# Patient Record
Sex: Male | Born: 1965 | Race: White | Hispanic: No | Marital: Married | Smoking: Never smoker
Health system: Southern US, Community
[De-identification: ages and names within clinical notes are randomized; demographics above are authoritative.]

## PROBLEM LIST (undated history)

## (undated) DIAGNOSIS — K922 Gastrointestinal hemorrhage, unspecified: Secondary | ICD-10-CM

## (undated) DIAGNOSIS — I10 Essential (primary) hypertension: Secondary | ICD-10-CM

## (undated) DIAGNOSIS — D696 Thrombocytopenia, unspecified: Secondary | ICD-10-CM

## (undated) HISTORY — PX: WRIST SURGERY: SHX841

## (undated) HISTORY — DX: Thrombocytopenia, unspecified: D69.6

## (undated) HISTORY — PX: TRUNK SKIN LESION EXCISIONAL BIOPSY: SUR474

## (undated) HISTORY — DX: Gastrointestinal hemorrhage, unspecified: K92.2

---

## 2005-07-22 ENCOUNTER — Emergency Department (HOSPITAL_COMMUNITY): Admission: EM | Admit: 2005-07-22 | Discharge: 2005-07-22 | Payer: Self-pay | Admitting: Emergency Medicine

## 2006-03-04 ENCOUNTER — Emergency Department (HOSPITAL_COMMUNITY): Admission: EM | Admit: 2006-03-04 | Discharge: 2006-03-04 | Payer: Self-pay | Admitting: Family Medicine

## 2006-03-06 ENCOUNTER — Emergency Department (HOSPITAL_COMMUNITY): Admission: EM | Admit: 2006-03-06 | Discharge: 2006-03-06 | Payer: Self-pay | Admitting: Emergency Medicine

## 2006-06-15 ENCOUNTER — Emergency Department (HOSPITAL_COMMUNITY): Admission: EM | Admit: 2006-06-15 | Discharge: 2006-06-15 | Payer: Self-pay | Admitting: Emergency Medicine

## 2010-10-31 LAB — COMPREHENSIVE METABOLIC PANEL
AST: 31
Albumin: 4.7
Alkaline Phosphatase: 67
Chloride: 109
GFR calc Af Amer: >60
Potassium: 3.8
Total Bilirubin: 1.1

## 2010-10-31 LAB — CBC
Platelets: 132 — ABNORMAL LOW
WBC: 10.8 — ABNORMAL HIGH

## 2010-10-31 LAB — DIFFERENTIAL
Basophils Absolute: 0
Basophils Relative: 0
Eosinophils Absolute: 0.1
Eosinophils Relative: 1
Lymphocytes Relative: 6 — ABNORMAL LOW
Lymphs Abs: 0.6 — ABNORMAL LOW
Monocytes Absolute: 0.2
Monocytes Relative: 2 — ABNORMAL LOW
Neutro Abs: 9.9 — ABNORMAL HIGH
Neutrophils Relative %: 92 — ABNORMAL HIGH

## 2010-10-31 LAB — LIPASE, BLOOD: Lipase: 23

## 2012-03-22 ENCOUNTER — Emergency Department (HOSPITAL_COMMUNITY): Payer: Managed Care, Other (non HMO)

## 2012-03-22 ENCOUNTER — Inpatient Hospital Stay (HOSPITAL_COMMUNITY): Payer: Managed Care, Other (non HMO)

## 2012-03-22 ENCOUNTER — Inpatient Hospital Stay (HOSPITAL_COMMUNITY)
Admission: EM | Admit: 2012-03-22 | Discharge: 2012-03-23 | DRG: 379 | Disposition: A | Discharge status: Home / Self Care | Payer: Managed Care, Other (non HMO) | Attending: Internal Medicine | Admitting: Internal Medicine

## 2012-03-22 ENCOUNTER — Encounter (HOSPITAL_COMMUNITY): Payer: Self-pay | Admitting: Emergency Medicine

## 2012-03-22 DIAGNOSIS — E669 Obesity, unspecified: Secondary | ICD-10-CM

## 2012-03-22 DIAGNOSIS — D696 Thrombocytopenia, unspecified: Secondary | ICD-10-CM | POA: Diagnosis present

## 2012-03-22 DIAGNOSIS — R7302 Impaired glucose tolerance (oral): Secondary | ICD-10-CM

## 2012-03-22 DIAGNOSIS — K7689 Other specified diseases of liver: Secondary | ICD-10-CM | POA: Diagnosis present

## 2012-03-22 DIAGNOSIS — E663 Overweight: Secondary | ICD-10-CM | POA: Diagnosis present

## 2012-03-22 DIAGNOSIS — Z6829 Body mass index (BMI) 29.0-29.9, adult: Secondary | ICD-10-CM

## 2012-03-22 DIAGNOSIS — I10 Essential (primary) hypertension: Secondary | ICD-10-CM | POA: Diagnosis present

## 2012-03-22 DIAGNOSIS — R7309 Other abnormal glucose: Secondary | ICD-10-CM | POA: Diagnosis present

## 2012-03-22 DIAGNOSIS — K760 Fatty (change of) liver, not elsewhere classified: Secondary | ICD-10-CM | POA: Diagnosis present

## 2012-03-22 DIAGNOSIS — R040 Epistaxis: Secondary | ICD-10-CM | POA: Diagnosis present

## 2012-03-22 DIAGNOSIS — K922 Gastrointestinal hemorrhage, unspecified: Secondary | ICD-10-CM

## 2012-03-22 DIAGNOSIS — R748 Abnormal levels of other serum enzymes: Secondary | ICD-10-CM | POA: Diagnosis present

## 2012-03-22 DIAGNOSIS — K920 Hematemesis: Principal | ICD-10-CM | POA: Diagnosis present

## 2012-03-22 HISTORY — DX: Essential (primary) hypertension: I10

## 2012-03-22 LAB — CBC WITH DIFFERENTIAL/PLATELET
Basophils Relative: 0 % (ref 0–1)
Eosinophils Absolute: 0.1 10*3/uL (ref 0.0–0.7)
Eosinophils Relative: 1 % (ref 0–5)
Hemoglobin: 17.1 g/dL — ABNORMAL HIGH (ref 13.0–17.0)
MCH: 30.6 pg (ref 26.0–34.0)
MCHC: 36.5 g/dL — ABNORMAL HIGH (ref 30.0–36.0)
MCV: 83.7 fL (ref 78.0–100.0)
Monocytes Absolute: 0.6 10*3/uL (ref 0.1–1.0)
Monocytes Relative: 6 % (ref 3–12)
Neutrophils Relative %: 87 % — ABNORMAL HIGH (ref 43–77)

## 2012-03-22 LAB — COMPREHENSIVE METABOLIC PANEL
Albumin: 4.9 g/dL (ref 3.5–5.2)
BUN: 21 mg/dL (ref 6–23)
Calcium: 9.5 mg/dL (ref 8.4–10.5)
Creatinine, Ser: 1.05 mg/dL (ref 0.50–1.35)
GFR calc Af Amer: >90 mL/min (ref >90)
Glucose, Bld: 120 mg/dL — ABNORMAL HIGH (ref 70–99)
Potassium: 4 mEq/L (ref 3.5–5.1)
Total Protein: 7.9 g/dL (ref 6.0–8.3)

## 2012-03-22 LAB — CBC
HCT: 41.7 % (ref 39.0–52.0)
Hemoglobin: 14.9 g/dL (ref 13.0–17.0)
Hemoglobin: 15 g/dL (ref 13.0–17.0)
MCH: 31 pg (ref 26.0–34.0)
MCH: 30.5 pg (ref 26.0–34.0)
MCHC: 36.7 g/dL — ABNORMAL HIGH (ref 30.0–36.0)
MCHC: 36 g/dL (ref 30.0–36.0)
Platelets: 81 10*3/uL — ABNORMAL LOW (ref 150–400)
RBC: 4.91 MIL/uL (ref 4.22–5.81)
RDW: 13.8 % (ref 11.5–15.5)

## 2012-03-22 LAB — TYPE AND SCREEN: ABO/RH(D): A NEG

## 2012-03-22 LAB — PROTIME-INR
INR: 0.98 (ref 0.00–1.49)
Prothrombin Time: 12.9 seconds (ref 11.6–15.2)

## 2012-03-22 LAB — APTT: aPTT: 26 seconds (ref 24–37)

## 2012-03-22 LAB — LIPASE, BLOOD: Lipase: 36 U/L (ref 11–59)

## 2012-03-22 MED ORDER — PANTOPRAZOLE SODIUM 40 MG IV SOLR
80.0000 mg | Freq: Once | INTRAVENOUS | Status: AC
  Administered 2012-03-22: 80 mg via INTRAVENOUS
  Filled 2012-03-22 (×2): qty 80

## 2012-03-22 MED ORDER — SODIUM CHLORIDE 0.9 % IV BOLUS (SEPSIS)
1000.0000 mL | Freq: Once | INTRAVENOUS | Status: AC
  Administered 2012-03-22: 1000 mL via INTRAVENOUS

## 2012-03-22 MED ORDER — KETOROLAC TROMETHAMINE 30 MG/ML IJ SOLN
30.0000 mg | Freq: Once | INTRAMUSCULAR | Status: AC
  Administered 2012-03-22: 30 mg via INTRAVENOUS
  Filled 2012-03-22: qty 1

## 2012-03-22 MED ORDER — SODIUM CHLORIDE 0.9 % IV SOLN
INTRAVENOUS | Status: DC
  Administered 2012-03-22 – 2012-03-23 (×2): via INTRAVENOUS

## 2012-03-22 MED ORDER — WHITE PETROLATUM GEL
Status: AC
  Administered 2012-03-22: 0.2
  Filled 2012-03-22: qty 5

## 2012-03-22 MED ORDER — ONDANSETRON HCL 4 MG/2ML IJ SOLN
4.0000 mg | Freq: Three times a day (TID) | INTRAMUSCULAR | Status: AC | PRN
  Administered 2012-03-22: 4 mg via INTRAVENOUS
  Filled 2012-03-22: qty 2

## 2012-03-22 MED ORDER — ONDANSETRON HCL 4 MG/2ML IJ SOLN
4.0000 mg | Freq: Once | INTRAMUSCULAR | Status: AC
  Administered 2012-03-22: 4 mg via INTRAVENOUS
  Filled 2012-03-22: qty 2

## 2012-03-22 MED ORDER — SODIUM CHLORIDE 0.9 % IJ SOLN
3.0000 mL | Freq: Two times a day (BID) | INTRAMUSCULAR | Status: DC
  Administered 2012-03-22 – 2012-03-23 (×2): 3 mL via INTRAVENOUS

## 2012-03-22 MED ORDER — MORPHINE SULFATE 2 MG/ML IJ SOLN
1.0000 mg | INTRAMUSCULAR | Status: DC | PRN
  Administered 2012-03-23: 2 mg via INTRAVENOUS
  Filled 2012-03-22: qty 1

## 2012-03-22 MED ORDER — FENTANYL CITRATE 0.05 MG/ML IJ SOLN
50.0000 ug | Freq: Once | INTRAMUSCULAR | Status: AC
  Administered 2012-03-22: 50 ug via INTRAVENOUS
  Filled 2012-03-22: qty 2

## 2012-03-22 MED ORDER — FUROSEMIDE 10 MG/ML IJ SOLN: 100.0000 mg | Freq: Three times a day (TID) | INTRAMUSCULAR | Status: DC

## 2012-03-22 MED ORDER — TRAMADOL HCL 50 MG PO TABS
50.0000 mg | ORAL_TABLET | Freq: Once | ORAL | Status: AC
  Administered 2012-03-22: 50 mg via ORAL
  Filled 2012-03-22: qty 1

## 2012-03-22 MED ORDER — PANTOPRAZOLE SODIUM 40 MG IV SOLR
40.0000 mg | INTRAVENOUS | Status: DC
Start: 2012-03-23 — End: 2012-03-23
  Administered 2012-03-22: 40 mg via INTRAVENOUS
  Filled 2012-03-22 (×3): qty 40

## 2012-03-22 MED ORDER — SODIUM CHLORIDE 0.9 % IV SOLN
INTRAVENOUS | Status: AC
  Administered 2012-03-22: 1000 mL via INTRAVENOUS

## 2012-03-22 NOTE — Consult Note (Signed)
[facility] Consult: 11:57 AM 03/22/2012   Referring Zerline Melchior: Dr Ranae Palms in ED  Primary Care Physician:  None Primary Gastroenterologist:  unassigned   Reason for Consultation:  Hemetemesis  HPI: Nicholas Jennings is a 47 y.o. male.  Has occasional back pain.  Not using Advil for at least one year.  No significant reflux sxs, anorexia, nausea or other active upper GI sxs in past. Sees blood in stool every couple of months, small amounts with wiping and sometimes in stool itself.  Ate well yesterday at lunch and dinner.  Took 400 Mg Ibuprofen around 10 AM for mid back pain.  Awakened acutely at 2 AM with nausea, proceeded to have 5 episodes of hematemisis, vigorous gagging, epistaxis, over next few hours. No stools.  In ED one episode where a tablespoon or so of blood seen with emesis.  His throat is sore now from gagging.  Also dripping blood from right nares in ED.    Hgb is elevated at 17.1.  coags normal.  BUN elevated at 26 Zofran has relieved the emesis.  Got a bolus or 80 mg Protonix.  No abdominal pain.  No dizziness or presyncope. Currently has headache.  Weight up 15 # in 6 months, not playing golf as much and therefore having less back pain.      Prior to Admission medications   Medication Sig Start Date End Date Taking? Authorizing Nicholas Jennings         ibuprofen (ADVIL,MOTRIN) 200 MG tablet Take 400 mg by mouth every 6 (six) hours as needed for pain or fever. Only took 400 mg once on 03/21/12:  No chronic use.   Yes Historical Nicholas Gunnels, MD    Scheduled Meds: . fentaNYL  50 mcg Intravenous Once   Infusions: . sodium chloride 1,000 mL (03/22/12 1130)   PRN Meds:    Allergies as of 03/22/2012  . (No Known Allergies)    History reviewed. No pertinent family history. Family history: No colon cancer, no ulcer disease, no GI bleeds.   Mom takes anticoagulation.  History    Social History   .  Marital Status:  Married              .      Occupational History   .  Computer work at [employer]     Social History Main Topics   .  Smoking status:  Never Smoker   .  Smokeless tobacco:  Not on file   .  Alcohol Use:  None ever  .  Drug Use:  No         REVIEW OF SYSTEMS: Constitutional:  Per HPI ENT:  Per HPI Pulm:  No SOB or cough CV:  No chest pain or palps GU:  No blood in urine.  No frequency GI:  Per HPI Heme:  No issues with low blood counts.    Transfusions:  never Neuro:  No vision blurriness, no  Tingling or numbness Derm:  No rash, no sores, no itching Endocrine:  No sweats or excessive thirst.  Immunization: no flu shot this term Travel:  none   PHYSICAL EXAM: Vital signs in last 24 hours: Temp:  [98.9 F (37.2 C)] 98.9 F (37.2 C) (03/10 0829) Pulse Rate:  [102-107] 107 (03/10 1100) Resp:  [17-25] 25 (03/10 1100) BP: (92-142)/(51-92) 92/51 mmHg (03/10 1100) SpO2:  [95 %-98 %] 98 % (03/10 1100)  General: anxious, pleasant WM who is slightly diaphoretic.  He does not look acutely ill.  Head:  No asymmetry or signs of trauma  Eyes:  No icterus or pallor Ears:  Not HOH  Nose:  There is blood in and erythema in right nares Mouth:  Beige, coating on tongue.  No bleeding Neck:  No mass, no JVD Lungs:  Clear B.  No resp distress Heart: Tachy (mild), regular.  No MRG Abdomen:  Soft, NT, ND, overweight, no mass, no bruits, no HSM.   Rectal: defered   Musc/Skeltl: no joint swelling or erythema Extremities:  No pedal edema.  Feet warm  Neurologic:  Oriented x3.  No tremor.  Limb strength full. Skin:  No telangectasia Tattoos:  none Nodes:  No inguinal adenopathy GU:  No scrotal edema   Psych:  Pleasant, anxious, appropriate.     LAB RESULTS:  Recent Labs  03/22/12 0846  WBC 10.8*  HGB 17.1*  HCT 46.8  PLT 109*   BMET Lab Results  Component Value Date   NA 144 03/22/2012   NA 145 06/15/2006   K 4.0 03/22/2012   K 3.8 06/15/2006   CL 104 03/22/2012   CL 109 06/15/2006    CO2 28 03/22/2012   CO2 24 06/15/2006   GLUCOSE 120* 03/22/2012   GLUCOSE 145* 06/15/2006   BUN 21 03/22/2012   BUN 26* 06/15/2006   CREATININE 1.05 03/22/2012   CREATININE 1.04 06/15/2006   CALCIUM 9.5 03/22/2012   CALCIUM 9.5 06/15/2006   LFT  Recent Labs  03/22/12 0846  PROT 7.9  ALBUMIN 4.9  AST 34  ALT 62*  ALKPHOS 79  BILITOT 0.9   PT/INR Lab Results  Component Value Date   INR 0.98 03/22/2012      RADIOLOGY STUDIES: Dg Chest Port 1 View 03/22/2012  *RADIOLOGY REPORT*  Clinical Data: Vomiting blood, cough, shortness of breath  PORTABLE CHEST - 1 VIEW  Comparison: None.  Findings: No active infiltrate or effusion is seen.  The heart is within normal limits in size.  No bony abnormality is noted.  IMPRESSION: No active lung disease.   Original Report Authenticated By: Dwyane Dee, M.D.     ENDOSCOPIC STUDIES: None ever  IMPRESSION: *  Hematemesis along with epistaxis.  ? If blood in emesis is of GI vs ENT source.  No active GI sxs leading up to these events. Possible GI causes include MWT, ulcer.  *  Thrombocytopenia.  *  Elevated RBC count.  *  Mild increase of ALT.  *  Hyperglycemia.  Pt certainly could have previously undiscovered AODM.   *  Overweight. ? Obesity.  No weight assay yet.    PLAN: *  No endoscopy.  For now treat as hemetemesis secondary to epistaxis.  Case was d/w Dr Leone Payor.   *  Will begin empiric IV Protonix though got large bolus dose already. CBC BID x 2.  Weigh pt.  *  Consider ENT evaluation.  *  Occasional blood per rectum.  None in > 2 months.  No associated change in bowel habits   LOS: 0 days   Nicholas Jennings  03/22/2012, 11:57 AM Pager: [phone]   Nicholas Jennings GI Attending  I have also seen and assessed the patient and agree with the above note.  My impression is that of epistaxis causing vomiting and hematemesis most likely.  Do not think the limited dose of NSAID precipitated anything.  He does have abnormal transaminase,  Thrombocytopenia (mild and not causing bleeding) fatty liver on 2008 CT - so will check [place] and HCV Ab (all baby  boomers should be screened) . Does not have PE stigmata of cirrhosis.   Also hx hematochezia/rectal bleeding but not in 6 months. Needs eventual colonoscopy.  toradol x 1 for headache.  Iva Boop, MD, [facility] [phone] (pager) 03/22/2012 2:44 PM

## 2012-03-22 NOTE — ED Provider Notes (Signed)
History     CSN: [id number]  Arrival date & time 03/22/12  7829   First MD Initiated Contact with Patient 03/22/12 [id number]      Chief Complaint  Patient presents with  . Hematemesis    (Consider location/radiation/quality/duration/timing/severity/associated sxs/prior treatment) HPI Pt states he woke at 0200 AM retching and vomiting small amounts of red blood. He has had multiple such episodes overnight. 1 episode of loose stool. Pt states he noticed small amount of epistaxis after vomiting this AM. Denied abd pain, chest pain. +ocassional NSAID use. Pt states amount of total blood vomited is less than 1 cup. Past Medical History  Diagnosis Date  . Hypertension     not on medications    Past Surgical History  Procedure Laterality Date  . Wrist surgery      left, cyst and bone spur  . Trunk skin lesion excisional biopsy      back    Family History  Problem Relation Age of Onset  . Thrombocytopenia Mother     History  Substance Use Topics  . Smoking status: Never Smoker   . Smokeless tobacco: Not on file  . Alcohol Use: No      Review of Systems  Constitutional: Positive for diaphoresis. Negative for fever and chills.  HENT: Positive for nosebleeds and neck pain. Negative for sore throat and neck stiffness.   Respiratory: Negative for shortness of breath.   Cardiovascular: Negative for chest pain.  Gastrointestinal: Positive for nausea and vomiting. Negative for abdominal pain, diarrhea, constipation and blood in stool.  Genitourinary: Negative for dysuria.  Musculoskeletal: Negative for myalgias and back pain.  Skin: Negative for rash and wound.  Neurological: Negative for weakness, numbness and headaches.  All other systems reviewed and are negative.    Allergies  Review of patient's allergies indicates no known allergies.  Home Medications   No current outpatient prescriptions on file.  BP 119/73  Pulse 98  Temp(Src) 99.7 F (37.6 C) (Oral)  Resp 19   Ht 5\' 11"  (1.803 m)  Wt 212 lb 15.4 oz (96.6 kg)  BMI 29.72 kg/m2  SpO2 91%  Physical Exam  Nursing note and vitals reviewed. Constitutional: He is oriented to person, place, and time. He appears well-developed and well-nourished. No distress.  Currently retching forcibley producing small amounts of red blood  HENT:  Head: Normocephalic and atraumatic.  Mouth/Throat: Oropharynx is clear and moist.  No blood in posterior pharynx. Small amount of red blood in R nare. No active bleeding.  Sensitive gag reflex   Eyes: EOM are normal. Pupils are equal, round, and reactive to light.  Neck: Normal range of motion. Neck supple.  Cardiovascular: Normal rate and regular rhythm.   Pulmonary/Chest: Effort normal and breath sounds normal. No respiratory distress. He has no wheezes. He has no rales. He exhibits no tenderness.  Abdominal: Soft. Bowel sounds are normal. He exhibits no distension and no mass. There is no tenderness. There is no rebound and no guarding.  Musculoskeletal: Normal range of motion. He exhibits no edema and no tenderness.  Neurological: He is alert and oriented to person, place, and time.  Move all ext without deficit  Skin: Skin is warm. No rash noted. He is diaphoretic. No erythema.  Psychiatric: He has a normal mood and affect. His behavior is normal.    ED Course  Procedures (including critical care time)  Labs Reviewed  CBC WITH DIFFERENTIAL - Abnormal; Notable for the following:    WBC 10.8 (*)  Hemoglobin 17.1 (*)    MCHC 36.5 (*)    Platelets 109 (*)    Neutrophils Relative 87 (*)    Neutro Abs 9.5 (*)    Lymphocytes Relative 6 (*)    All other components within normal limits  COMPREHENSIVE METABOLIC PANEL - Abnormal; Notable for the following:    Glucose, Bld 120 (*)    ALT 62 (*)    GFR calc non Af Amer 82 (*)    All other components within normal limits  CBC - Abnormal; Notable for the following:    MCHC 36.7 (*)    Platelets 81 (*)    All  other components within normal limits  BASIC METABOLIC PANEL - Abnormal; Notable for the following:    Glucose, Bld 120 (*)    Calcium 8.2 (*)    GFR calc non Af Amer 79 (*)    All other components within normal limits  CBC - Abnormal; Notable for the following:    Platelets 77 (*)    All other components within normal limits  CBC - Abnormal; Notable for the following:    HCT 38.4 (*)    MCHC 36.5 (*)    Platelets 76 (*)    All other components within normal limits  GLUCOSE, CAPILLARY - Abnormal; Notable for the following:    Glucose-Capillary 112 (*)    All other components within normal limits  MRSA PCR SCREENING  LIPASE, BLOOD  PROTIME-INR  APTT  HEPATITIS C ANTIBODY  CBC  CBC  CBC  TYPE AND SCREEN  ABO/RH   US Abdomen Complete  03/22/2012  *RADIOLOGY REPORT*  Clinical Data:  Abnormal liver function tests.  Vomiting.  COMPLETE ABDOMINAL ULTRASOUND  Comparison:  Report of the abdomen CT dated 06/15/2006.  Those images are not available for comparison at this time.  Findings:  Gallbladder:  No gallstones, gallbladder wall thickening, or pericholecystic fluid.  Common bile duct:  Normal in caliber, measuring 4.2 mm in diameter proximally.  Liver:  Diffusely echogenic.  Corresponding steatosis described in the previous CT report.  1.0 cm cyst in the anterior aspect of the liver on the right.  IVC:  Poorly visualized.  Grossly normal.  Pancreas:  Poorly visualized.  Grossly normal.  Spleen:  Normal, measuring 12.0 cm in length.  Right Kidney:  Normal, measuring 11.5 cm in length.  Left Kidney:  Normal, measuring 11.5 cm in length.  Abdominal aorta:  Normal in caliber.  IMPRESSION:  1.  Diffuse hepatic steatosis. 2.  1.0 cm liver cyst. 3.  Otherwise, normal examination.   Original Report Authenticated By: Beckie Salts, M.D.    Dg Chest Port 1 View  03/22/2012  *RADIOLOGY REPORT*  Clinical Data: Vomiting blood, cough, shortness of breath  PORTABLE CHEST - 1 VIEW  Comparison: None.   Findings: No active infiltrate or effusion is seen.  The heart is within normal limits in size.  No bony abnormality is noted.  IMPRESSION: No active lung disease.   Original Report Authenticated By: Dwyane Dee, M.D.      1. Hematemesis   2. Abnormal transaminases   3. Anterior epistaxis       MDM   GI to see in ED. [facility] will admit. Pt stable in ED. No further hematemesis.        Loren Racer, MD 03/23/12 [phone]

## 2012-03-22 NOTE — ED Notes (Signed)
Family at bedside, and patient is resting.

## 2012-03-22 NOTE — ED Notes (Signed)
Pt sts vomiting BRB and nose bleed this am; pt sts hx of same in past that told was virus; pt sts some lightheadedness

## 2012-03-22 NOTE — H&P (Signed)
[facility] History and Physical  Nicholas Jennings ZOX:[id number] DOB: 1965-09-01 DOA: 03/22/2012  Referring physician: Gilmer Mor PCP: [name], MD  Specialists: GI called and has seen patient  Chief Complaint: GI bleed?  HPI: Nicholas Jennings is a 47 y.o. male who presented to the Ed 03/22/12-he states this all staretd when he went to church and had back pain and took some Ibuprofen 250 mg x 2 tablets.  He felt some stiffness and has felt sore-he thinks it was over 2 weeks.  Was shoveling snow the day before dmission 3.7, but didn't have much pain.    At about 19:30 and ate a sloppy joe.  Went to bed around 10:130.  Awoke around 14:00 started to experience vomiting with some assosc. emesis-he noted some nasal bleeding as well at the same time and noticed some dark stool, which was medium formed stool, not frankly red but darker brown than usual He thinks he vomited about 5-6 times until 7:30 am this morning. He is not able to tell me, lidocaine with into the common but does think that the blood was mixed with the vomitus. The amount of blood was more noticeable from his nose.  The blood in vomit seemed to be mixed with the blood and liquid. He reports being somewhat more lightheaded on changing position from seated to standing Doesn't  drink ETOH or take drugs  Review of Systems: The patient deneis any recent illnesses, any fever.  No sick contacts.  + abd pain in the center of the stomach at umbilicus, and was previously more att he top and sides.  Feels some soreness in the throat.  No CP currently, but had some with the vomiting.  No cough or cold   Past Medical History  Diagnosis Date  . Hypertension     not on medications   Past Surgical History  Procedure Laterality Date  . Wrist surgery      left, cyst and bone spur  . Trunk skin lesion excisional biopsy      back   Social History:  reports that he has never smoked. He does not have any smokeless tobacco history on  file. He reports that he does not drink alcohol or use illicit drugs.  No Known Allergies  Family History  Problem Relation Age of Onset  . Thrombocytopenia Mother     Prior to Admission medications   Medication Sig Start Date End Date Taking? Authorizing Provider  famotidine (PEPCID) 20 MG tablet Take 20 mg by mouth daily as needed for heartburn (upset stomach).   Yes Historical Provider, MD  ibuprofen (ADVIL,MOTRIN) 200 MG tablet Take 400 mg by mouth every 6 (six) hours as needed for pain or fever.   Yes Historical Provider, MD   Physical Exam: Filed Vitals:   03/22/12 1000 03/22/12 1100 03/22/12 1145 03/22/12 1200  BP: 134/78 92/51 127/83 136/86  Pulse: 102 107 105 106  Temp:      TempSrc:      Resp: 17 25 20 15   SpO2: 98% 98% 98% 97%     General:  Alert pleasant oriented no apparent distress  Eyes: No pallor no icterus  ENT: Mouth smells of blood however no noted blood in the back of the mouth there does appear to be some crusting along the nose.  Neck: Supple nontender  Cardiovascular: S1-S2 no murmur rub or gallop  Respiratory: Clinically clear  Abdomen: Tender in the umbilical region with some mild pain on pressure does not appear  distended bowel sounds slightly decreased no rebound or guarding  Skin: No lower extremity edema  Musculoskeletal: Good range of motion to all 4 extremities  Psychiatric: Euthymic   Labs on Admission:  Basic Metabolic Panel:  Recent Labs Lab 03/22/12 0846  NA 144  K 4.0  CL 104  CO2 28  GLUCOSE 120*  BUN 21  CREATININE 1.05  CALCIUM 9.5   Liver Function Tests:  Recent Labs Lab 03/22/12 0846  AST 34  ALT 62*  ALKPHOS 79  BILITOT 0.9  PROT 7.9  ALBUMIN 4.9    Recent Labs Lab 03/22/12 0846  LIPASE 36   No results found for this basename: AMMONIA,  in the last 168 hours CBC:  Recent Labs Lab 03/22/12 0846  WBC 10.8*  NEUTROABS 9.5*  HGB 17.1*  HCT 46.8  MCV 83.7  PLT 109*   Cardiac Enzymes: No  results found for this basename: CKTOTAL, CKMB, CKMBINDEX, TROPONINI,  in the last 168 hours  BNP (last 3 results) No results found for this basename: PROBNP,  in the last 8760 hours CBG: No results found for this basename: GLUCAP,  in the last 168 hours  Radiological Exams on Admission: Dg Chest Port 1 View  03/22/2012  *RADIOLOGY REPORT*  Clinical Data: Vomiting blood, cough, shortness of breath  PORTABLE CHEST - 1 VIEW  Comparison: None.  Findings: No active infiltrate or effusion is seen.  The heart is within normal limits in size.  No bony abnormality is noted.  IMPRESSION: No active lung disease.   Original Report Authenticated By: Dwyane Dee, M.D.     EKG: Independently reviewed. None performed  Assessment/Plan Principal Problem:   Probable GI bleed due to NSAIDs Active Problems:   Anterior epistaxis   Impaired glucose tolerance   Obesity   HTN (hypertension)   1. Likely upper GI bleed vs epsitaxis-history is particularly concerning given he had multiple episodes which might have been provoked by use of NSAIDs. Differential is less likely to be gastroenteritis given his wife ate similar food to what he had yesterday and he does not have a fever although he has a mild leukocytosis. We will follow his hemoglobin trend 8 hourly over the next day and if this stabilizes we will not transfuse him however he is been typed and screened. He received Protonix infusion which was transitioned to daily protonic 40 mg IV. He'll be kept n.p.o. For now 2. Orthostasis/tachycardia-likely secondary to #1. This is a physiologic response 3. Potential epistaxis-certainly might have had episodes of epistaxis leading to posterior pharyngeal bleeding going down into his throat and causing gastric irritation-this is less likely given he saw the bleeding from the nose at the time of retching. He might have had increased intracranial pressure causing epistaxis with his episodes of vomiting I would hold on ENT  consult at present time 4. Impaired glucose tolerance-sugar 120 on today's panel. Follow tomorrow's. If above 200 at any time will cover with insulin and get an A1c 5. History of hypertension will probably need adjustment of medications before discharge. He has no primary care physician 6. Mild obesity. Monitor 7. Moderate thrombocytopenia-this could be reactive secondary to blood loss. His platelet count is gone as low as 109. If needed we'll get a platelet smear. It is less likely that this may represent a small framed malignancy given his age  Gastroenterology has already seen the patient she can put  Code Status: Full  Family Communication: Wife at bedside who understands  Disposition Plan:  Step down overnight potential transfer to telemetry )  Time spent: 70 minutes  Mahala Menghini [facility] Pager [phone]  If 7PM-7AM, please contact night-coverage [email or web address] Password [facility] 03/22/2012, 12:58 PM

## 2012-03-23 LAB — CBC
HCT: 38.4 % — ABNORMAL LOW (ref 39.0–52.0)
MCH: 30.9 pg (ref 26.0–34.0)
MCH: 30 pg (ref 26.0–34.0)
MCHC: 36.1 g/dL — ABNORMAL HIGH (ref 30.0–36.0)
MCV: 84.8 fL (ref 78.0–100.0)
Platelets: 76 10*3/uL — ABNORMAL LOW (ref 150–400)
RBC: 4.53 MIL/uL (ref 4.22–5.81)
RDW: 13.7 % (ref 11.5–15.5)

## 2012-03-23 LAB — HEPATITIS C ANTIBODY: HCV Ab: NEGATIVE

## 2012-03-23 LAB — GLUCOSE, CAPILLARY: Glucose-Capillary: 112 mg/dL — ABNORMAL HIGH (ref 70–99)

## 2012-03-23 LAB — BASIC METABOLIC PANEL
CO2: 26 mEq/L (ref 19–32)
GFR calc Af Amer: >90 mL/min (ref >90)
Sodium: 140 mEq/L (ref 135–145)

## 2012-03-23 MED ORDER — ACETAMINOPHEN 325 MG PO TABS: 650.0000 mg | ORAL_TABLET | Freq: Four times a day (QID) | ORAL | Status: DC | PRN

## 2012-03-23 MED ORDER — BUTALBITAL-APAP-CAFFEINE 50-325-40 MG PO TABS
2.0000 | ORAL_TABLET | Freq: Once | ORAL | Status: AC
  Administered 2012-03-23: 2 via ORAL
  Filled 2012-03-23: qty 2

## 2012-03-23 MED ORDER — ONDANSETRON HCL 4 MG/2ML IJ SOLN
4.0000 mg | Freq: Four times a day (QID) | INTRAMUSCULAR | Status: DC | PRN
  Administered 2012-03-23: 4 mg via INTRAVENOUS
  Filled 2012-03-23: qty 2

## 2012-03-23 MED ORDER — PANTOPRAZOLE SODIUM 40 MG PO TBEC: 40.0000 mg | DELAYED_RELEASE_TABLET | Freq: Every day | ORAL | Status: DC

## 2012-03-23 MED ORDER — ONDANSETRON HCL 4 MG PO TABS: 4.0000 mg | ORAL_TABLET | Freq: Three times a day (TID) | ORAL | Status: DC | PRN

## 2012-03-23 MED ORDER — FAMOTIDINE 40 MG/5ML PO SUSR
20.0000 mg | Freq: Two times a day (BID) | ORAL | Status: DC
  Administered 2012-03-23: 20 mg via ORAL
  Filled 2012-03-23 (×2): qty 2.5

## 2012-03-23 MED ORDER — AMOXICILLIN-POT CLAVULANATE 500-125 MG PO TABS: 1.0000 | ORAL_TABLET | Freq: Three times a day (TID) | ORAL | Status: DC

## 2012-03-23 MED ORDER — HYDROCODONE-ACETAMINOPHEN 5-325 MG PO TABS
1.0000 | ORAL_TABLET | Freq: Four times a day (QID) | ORAL | Status: DC | PRN
  Administered 2012-03-23: 1 via ORAL
  Filled 2012-03-23: qty 1
  Filled 2012-03-23: qty 2

## 2012-03-23 MED ORDER — ACETAMINOPHEN 650 MG RE SUPP
650.0000 mg | Freq: Four times a day (QID) | RECTAL | Status: DC | PRN
Start: 2012-03-23 — End: 2012-03-23

## 2012-03-23 NOTE — Discharge Summary (Signed)
Physician Discharge Summary  Nicholas Jennings [name]:[id number] DOB: 1965/05/26 DOA: 03/22/2012  PCP: Nicholas Hillier, MD  Admit date: 03/22/2012 Discharge date: 03/23/2012  Time spent: >45 minutes  Recommendations for Outpatient Follow-up:  Will need CBC upon f/u with GI  Discharge Diagnoses:  Principal Problem: GI bleed  Active Problems:   Impaired glucose tolerance   Obesity   HTN (hypertension)   Fatty Liver   Thrombocytopenia   Discharge Condition: stable   Diet recommendation: easy to digest, heart healthy  Filed Weights   03/22/12 1900 03/23/12 0500  Weight: 97.1 kg (214 lb 1.1 oz) 96.6 kg (212 lb 15.4 oz)    History of present illness and hospital course:  This is a 47 year old male who presented to the ER on 3/10 with a complaint of vomiting blood. The patient had  eaten normal me the night before and woke up early morning after vomiting up his dinner. He had 3 episodes of vomiting of food contents and fourth episode consisted of blood. He had a fifth episode which was also bloody this time the blood not only came out through his mouth but through his nose as well. It appears that it was communicated to the admitting doctor and GI doctor and away that it seems that he may have had a nosebleed. -However the patient adamantly states to me today that he initially vomited blood and feels the second episode was also vomiting however passed through his nasopharynx and came out through his nose. He did not have any black stools yesterday and has not had any stools since. - Patienthas not vomited any more blood since the 2 episodes yesterday and has actually had a regular diet for lunch and is tolerating it well. He is anxious to be discharged home. I've spoken with Nicholas. Leone Jennings who will arrange for an early followup with him. He is advised to start taking proton X. 40 mg daily, avoid NSAIDs and continue to take a soft/easy to digest diet and over the next couple of days. -Hemoglobin  has dropped from 17.1 on admission to 13.6 today  Elevated LFTs/fatty liver -ALT was noted to be mildly elevated at 62 -An abdominal ultrasound was ordered by the GI team and he is found to have diffuse hepatic steatosis and a 1 cm liver cyst. - It has been communicated to the patient that he needs to lose weight steadily to help with resolution of the fatty liver.   low-grade fevers -Temperatures noted maximum of which is 100.5.  -He is currently asymptomatic as far as sinus, respiratory, GI or urinary symptoms.  -I have a mild suspicion that he may aspirated on some of his vomitus and may need a short course of antibiotics. -I have prescribed 5 days of Augmentin.  Thrombocytopenia -Cause uncertain- dropped from 109 on admission to 70 today. -As patient wants to be discharged home, at this point I'm unable to further order a peripheral smear. -Would recommend that he have a CBC performed on following up with GI.   Procedures: None  Consultations:  GI-Nicholas. Leone Jennings  Discharge Exam: Filed Vitals:   03/23/12 0500 03/23/12 0714 03/23/12 0800 03/23/12 1200  BP:  129/78 129/78 135/83  Pulse:  93 91 87  Temp:   99.7 F (37.6 C) 99.2 F (37.3 C)  TempSrc:   Oral Oral  Resp:  21 19 16   Height:      Weight: 96.6 kg (212 lb 15.4 oz)     SpO2:   94% 95%  General: alert, oriented x 3  Cardiovascular: RRR, no murmurs Resp: CTA b/l  Abd: soft, mild epigastric tenderness, BS+  Discharge Instructions      Discharge Orders   Future Appointments Provider Department Dept Phone   04/23/2012 9:45 AM Nicholas Boop, MD [facility] [phone]   Future Orders Complete By Expires     Diet - low sodium heart healthy  As directed     Comments:      Easy to digest soft diet    Discharge instructions  As directed     Comments:      Take Zofran for further vomiting.  Nicholas Jennings office will call you in regards to a follow up appt.  If they do not call you,  please call them- number has been provided.  If bleeding recurs please return to the ER    Increase activity slowly  As directed         Medication List    STOP taking these medications       ibuprofen 200 MG tablet  Commonly known as:  ADVIL,MOTRIN      TAKE these medications       amoxicillin-clavulanate 500-125 MG per tablet  Commonly known as:  AUGMENTIN  Take 1 tablet (500 mg total) by mouth 3 (three) times daily.     famotidine 20 MG tablet  Commonly known as:  PEPCID  Take 20 mg by mouth daily as needed for heartburn (upset stomach).     ondansetron 4 MG tablet  Commonly known as:  ZOFRAN  Take 1 tablet (4 mg total) by mouth every 8 (eight) hours as needed for nausea.     pantoprazole 40 MG tablet  Commonly known as:  PROTONIX  Take 1 tablet (40 mg total) by mouth daily.       Follow-up Information   Follow up with Nicholas Head, MD On . (you will be called in next few days)    Contact information:   [address] [place] [address] [phone]        The results of significant diagnostics from this hospitalization (including imaging, microbiology, ancillary and laboratory) are listed below for reference.    Significant Diagnostic Studies: US Abdomen Complete  03/22/2012  *RADIOLOGY REPORT*  Clinical Data:  Abnormal liver function tests.  Vomiting.  COMPLETE ABDOMINAL ULTRASOUND  Comparison:  Report of the abdomen CT dated 06/15/2006.  Those images are not available for comparison at this time.  Findings:  Gallbladder:  No gallstones, gallbladder wall thickening, or pericholecystic fluid.  Common bile duct:  Normal in caliber, measuring 4.2 mm in diameter proximally.  Liver:  Diffusely echogenic.  Corresponding steatosis described in the previous CT report.  1.0 cm cyst in the anterior aspect of the liver on the right.  IVC:  Poorly visualized.  Grossly normal.  Pancreas:  Poorly visualized.  Grossly normal.  Spleen:  Normal, measuring 12.0 cm in length.   Right Kidney:  Normal, measuring 11.5 cm in length.  Left Kidney:  Normal, measuring 11.5 cm in length.  Abdominal aorta:  Normal in caliber.  IMPRESSION:  1.  Diffuse hepatic steatosis. 2.  1.0 cm liver cyst. 3.  Otherwise, normal examination.   Original Report Authenticated By: Beckie Salts, M.D.    Dg Chest Port 1 View  03/22/2012  *RADIOLOGY REPORT*  Clinical Data: Vomiting blood, cough, shortness of breath  PORTABLE CHEST - 1 VIEW  Comparison: None.  Findings: No active infiltrate or effusion is  seen.  The heart is within normal limits in size.  No bony abnormality is noted.  IMPRESSION: No active lung disease.   Original Report Authenticated By: Dwyane Dee, M.D.     Microbiology: Recent Results (from the past 240 hour(s))  MRSA PCR SCREENING     Status: None   Collection Time    03/22/12  7:27 PM      Result Value Range Status   MRSA by PCR NEGATIVE  NEGATIVE Final   Comment:            The GeneXpert MRSA Assay (FDA     approved for NASAL specimens     only), is one component of a     comprehensive MRSA colonization     surveillance program. It is not     intended to diagnose MRSA     infection nor to guide or     monitor treatment for     MRSA infections.     Labs: Basic Metabolic Panel:  Recent Labs Lab 03/22/12 0846 03/23/12 0239  NA 144 140  K 4.0 3.8  CL 104 106  CO2 28 26  GLUCOSE 120* 120*  BUN 21 22  CREATININE 1.05 1.08  CALCIUM 9.5 8.2*   Liver Function Tests:  Recent Labs Lab 03/22/12 0846  AST 34  ALT 62*  ALKPHOS 79  BILITOT 0.9  PROT 7.9  ALBUMIN 4.9    Recent Labs Lab 03/22/12 0846  LIPASE 36   No results found for this basename: AMMONIA,  in the last 168 hours CBC:  Recent Labs Lab 03/22/12 0846 03/22/12 1638 03/22/12 1917 03/23/12 0239 03/23/12 1045  WBC 10.8* 8.9 6.9 5.4 4.2  NEUTROABS 9.5*  --   --   --   --   HGB 17.1* 14.9 15.0 14.0 13.6  HCT 46.8 40.6 41.7 38.4* 37.7*  MCV 83.7 84.4 84.9 84.8 83.0  PLT 109* 81* 77*  76* 70*   Cardiac Enzymes: No results found for this basename: CKTOTAL, CKMB, CKMBINDEX, TROPONINI,  in the last 168 hours BNP: BNP (last 3 results) No results found for this basename: PROBNP,  in the last 8760 hours CBG:  Recent Labs Lab 03/23/12 0714  GLUCAP 112*       Signed:  Marri Mcneff  [facility] 03/23/2012, 3:48 PM

## 2012-03-23 NOTE — Progress Notes (Signed)
NP Craige Cotta notified of pt temp of 100.5 orally. New orders to given PO/PR tylenol PRN if temp greater than 101. Will continue to monitor.

## 2012-03-23 NOTE — Progress Notes (Signed)
Discussed discharge instructions and medications with patient and Wife. Both verbalized understanding with all questions answered. VSS. Pt discharged home with wife.  [facility]

## 2012-03-23 NOTE — Progress Notes (Signed)
Utilization review completed.  

## 2012-03-23 NOTE — Progress Notes (Signed)
[facility] Daily Rounding Note 03/23/2012, 8:28 AM  SUBJECTIVE:       Note mild fever to 100.5 F. No BMs.  No nausea or emesis. Not dizzy.  Fells well.  Hungry.  Kept NPO Turned 47 yo today.  OBJECTIVE:         Vital signs in last 24 hours:    Temp:  [98.4 F (36.9 C)-100.5 F (38.1 C)] 99.7 F (37.6 C) (03/11 0800) Pulse Rate:  [98-111] 98 (03/11 0425) Resp:  [12-25] 19 (03/11 0425) BP: (92-142)/(51-92) 119/73 mmHg (03/11 0425) SpO2:  [91 %-99 %] 91 % (03/11 0425) Weight:  [96.6 kg (212 lb 15.4 oz)-97.1 kg (214 lb 1.1 oz)] 96.6 kg (212 lb 15.4 oz) (03/11 0500) Last BM Date: 03/22/12 General: looks well, comfortable   Heart: RRR Chest: clear Abdomen: soft, NT, ND.  Active BS  Extremities: no pedal edema Neuro/Psych:  Pleasant, relaxed.   Intake/Output from previous day: 03/10 0701 - 03/11 0700 In: 1100 [I.V.:1100] Out: 950 [Urine:950]  Intake/Output this shift:    Lab Results:  Recent Labs  03/22/12 1638 03/22/12 1917 03/23/12 0239  WBC 8.9 6.9 5.4  HGB 14.9 15.0 14.0  HCT 40.6 41.7 38.4*  PLT 81* 77* 76*   BMET  Recent Labs  03/22/12 0846 03/23/12 0239  NA 144 140  K 4.0 3.8  CL 104 106  CO2 28 26  GLUCOSE 120* 120*  BUN 21 22  CREATININE 1.05 1.08  CALCIUM 9.5 8.2*   LFT  Recent Labs  03/22/12 0846  PROT 7.9  ALBUMIN 4.9  AST 34  ALT 62*  ALKPHOS 79  BILITOT 0.9   PT/INR  Recent Labs  03/22/12 0846  LABPROT 12.9  INR 0.98   Hepatitis Panel  Recent Labs  03/22/12 1638  HCVAB NEGATIVE    Studies/Results: US Abdomen Complete 03/22/2012      IMPRESSION:  1.  Diffuse hepatic steatosis. 2.  1.0 cm liver cyst. 3.  Otherwise, normal examination.   Original Report Authenticated By: Beckie Salts, M.D.    Dg Chest Port 1 View 03/22/2012  *RADIOLOGY REPORT*  Clinical Data: Vomiting blood, cough, shortness of breath  PORTABLE CHEST - 1 VIEW  Comparison: None.  Findings: No active infiltrate or effusion is seen.  The heart is within  normal limits in size.  No bony abnormality is noted.  IMPRESSION: No active lung disease.   Original Report Authenticated By: Dwyane Dee, M.D.     ASSESMENT: * Hematemesis along with epistaxis. Suspect ENT source. No active GI sxs leading up to these events.  The H & H remain stable. * Thrombocytopenia.  *  Occasional blood per rectum every couple of months.  Never had colonoscopy * Mild increase of ALT.  Fatty liver and liver cyst on ultrasound, stable from 2008. Hep C Ab negative.  Denies etoh use.  * Hyperglycemia, mild.  * Overweight. BMI 29.9   PLAN: *  DC IV Protonix, start carb mod diet, stop rechecks of CBC.  Restart Famotidine, in use PTA *  Could go home.  No plans for EGD *  I DCd the telemetry and made activity ad lib *  ROV with Dr Leone Payor set for 04/23/12 as pt has hx rectal bleeding and may need colonoscopy.  *  If recurrent nasal bleeding, needs to see ENT    LOS: 1 day   Jennye Moccasin  03/23/2012, 8:28 AM Pager: [phone]  Agree with Ms. Heron Nay assessment and plan. Baldo Ash  Sena Slate, MD, [facility]  Also note that per my discussion with Dr. Butler Denmark - patient is stating that he vomited without blood first and then had bleeding so now possible MW tear more likely. Since he has been very stable since admit and those heal spontaneously 95% of time - ok to dc. I will move up his follow-up with me.  Iva Boop, MD, [facility] [phone] (pager) 03/23/2012 3:54 PM

## 2012-03-24 ENCOUNTER — Telehealth: Payer: Self-pay

## 2012-03-24 NOTE — Telephone Encounter (Signed)
Tolerating a regular diet, no further bleeding.   Office visit rescheduled with Mike Gip PA for 03/30/12 3:30

## 2012-03-24 NOTE — Telephone Encounter (Signed)
Message copied by Annett Fabian on Wed Mar 24, 2012  1:45 PM ------      Message from: Stan Head E      Created: Wed Mar 24, 2012  1:03 PM      Regarding: follow-up       Was just in hospital with hematemesis - orig thought epistaxis but might have been Mallory-Weiss tear            Also has intermittent rectal bleeding            Please call him to      1) check on him      2) change follow-up to sooner - office visit with advanced practitioner while I am supervising in next 1-2 weeks please       ------

## 2012-03-29 ENCOUNTER — Encounter: Payer: Self-pay | Admitting: *Deleted

## 2012-03-30 ENCOUNTER — Encounter: Payer: Self-pay | Admitting: Physician Assistant

## 2012-03-30 ENCOUNTER — Ambulatory Visit (INDEPENDENT_AMBULATORY_CARE_PROVIDER_SITE_OTHER): Payer: Managed Care, Other (non HMO) | Admitting: Physician Assistant

## 2012-03-30 ENCOUNTER — Other Ambulatory Visit (INDEPENDENT_AMBULATORY_CARE_PROVIDER_SITE_OTHER): Payer: Managed Care, Other (non HMO)

## 2012-03-30 VITALS — BP 128/88 | HR 81 | Ht 71.0 in | Wt 210.0 lb

## 2012-03-30 DIAGNOSIS — K920 Hematemesis: Secondary | ICD-10-CM

## 2012-03-30 LAB — CBC WITH DIFFERENTIAL/PLATELET
Basophils Relative: 0.3 % (ref 0.0–3.0)
Eosinophils Absolute: 0.1 10*3/uL (ref 0.0–0.7)
Eosinophils Relative: 2.8 % (ref 0.0–5.0)
HCT: 43.5 % (ref 39.0–52.0)
Lymphs Abs: 1.4 10*3/uL (ref 0.7–4.0)
MCHC: 35.2 g/dL (ref 30.0–36.0)
MCV: 86.6 fl (ref 78.0–100.0)
Monocytes Absolute: 0.3 10*3/uL (ref 0.1–1.0)
Neutrophils Relative %: 64 % (ref 43.0–77.0)
Platelets: 158 10*3/uL (ref 150.0–400.0)
RBC: 5.02 Mil/uL (ref 4.22–5.81)

## 2012-03-30 LAB — FERRITIN: Ferritin: 204 ng/mL (ref 22.0–322.0)

## 2012-03-30 NOTE — Progress Notes (Signed)
Subjective:    Patient ID: Nicholas Jennings, male    DOB: 08-Feb-1965, 47 y.o.   MRN: [id number]  HPI  "Nicholas Jennings" is a very nice 47 year old white male new to GI today. He was seen while he was briefly hospitalized a week ago at [facility]  by Dr. Leone Payor. He was admitted on 03/22/2012 and discharged on the 11th. He had presented of after an episode of hematemesis but there was some concern that this was actually epistaxis he did not undergo upper endoscopy. Patient relates today that he really does not think that this episode was due to a nosebleed and feels that he needs further evaluation. He states that he woke up from sleep that morning feeling nauseated and vomited early hard and the first time he vomited dark red blood came up. He says he went back to bed felt sick again in the second time he vomited he had blood come up from his mouth and also out of his nose. Says both times he was a large amount of dark red blood he also says that he vomited and retched hard. He did not develop any esophageal or stomach discomfort after that episode and had no melena or hematochezia. His not had any further vomiting since His hemoglobin on admission was 15 and followup on the 11th was 13.6. He also has a thrombocytopenia with platelet count in the 70,000 range of unknown etiology Upper abdominal ultrasound done during that admission did show diffuse hepatic steatosis, and a normal-sized spleen at 12 cm. Review of prior CT done in 2008 also discharged showed a diffusely fatty liver with mild splenomegaly. Platelet count in 2008 was 132. Patient does have family history of thrombocytopenia in his mother but does not know  further details. Family history is negative for liver disease as far as he is aware. He does not have history of alcohol abuse. He had hepatitis C antibody done this last admission and that was negative he has not had any lipid screening.    Review of Systems  Constitutional: Negative.   HENT:  Negative.   Eyes: Negative.   Respiratory: Negative.   Cardiovascular: Negative.   Gastrointestinal: Negative.   Endocrine: Negative.   Genitourinary: Negative.   Allergic/Immunologic: Negative.   Neurological: Negative.   Hematological: Negative.   Psychiatric/Behavioral: Negative.    Outpatient Prescriptions Prior to Visit  Medication Sig Dispense Refill  . ondansetron (ZOFRAN) 4 MG tablet Take 1 tablet (4 mg total) by mouth every 8 (eight) hours as needed for nausea.  20 tablet  0  . pantoprazole (PROTONIX) 40 MG tablet Take 1 tablet (40 mg total) by mouth daily.  30 tablet  0  . amoxicillin-clavulanate (AUGMENTIN) 500-125 MG per tablet Take 1 tablet (500 mg total) by mouth 3 (three) times daily.  15 tablet  0  . famotidine (PEPCID) 20 MG tablet Take 20 mg by mouth daily as needed for heartburn (upset stomach).       No facility-administered medications prior to visit.   No Known Allergies Patient Active Problem List  Diagnosis  . Anterior epistaxis  . Impaired glucose tolerance  . Obesity  . HTN (hypertension)  . Hematemesis  . Abnormal transaminases  . Thrombocytopenia  . Hepatic steatosis   History  Substance Use Topics  . Smoking status: Never Smoker   . Smokeless tobacco: Not on file  . Alcohol Use: No   family history includes Thrombocytopenia in his mother.     Objective:   Physical  Exam well-developed white male in no acute distress, pleasant blood pressure 120/88 pulse 80 one height 5 foot 11 weight 210. HEENT; nontraumatic normocephalic EOMI PERRLA sclera anicteric, Neck; supple no JVD, Cardiovascular; regular rate and rhythm with S1-S2 no murmur or gallop, Pulmonary; clear bilaterally, Abdomen; soft nontender nondistended no palpable hepatosplenomegaly or mass bowel sounds are, Rectal; exam not done today, Extremities; no clubbing cyanosis or edema skin warm and dry, Psych; mood and affect normal and appropriate        Assessment & Plan:  #33 47 year old  male with a self-limited episode of hematemesis. Etiology not clear- we'll need to rule out Mallory-Weiss tear which may have healed, esophagitis, peptic ulcer disease, versus other. #2 chronic thrombocytopenia, progressive-etiology unknown #3 diffuse hepatic steatosis-etiology unknown-concerned  with underlying liver disease especially with his thrombocytopenia and prior note of splenomegaly. #4 hypertension  Plan; schedule for upper endoscopy with Dr. Leone Payor later this week-procedure reviewed in detail with the patient and he is agreeable to proceed Continue Protonix 40 mg by mouth once daily Repeat CBC today, will also screen for underlying liver disease with hepatitis serologies, ferritin, ANA, alpha-1 antitrypsin and check fasting lipid panel He will need hematology referral-if it does not appear that his thrombocytopenia is secondary to liver disease/early cirrhosis.

## 2012-03-30 NOTE — Patient Instructions (Addendum)
Please go to the basement level to have your labs drawn.   Go to the basement level lab after the Endoscopy on Thursday 3-20 for the Lipid Panel. The order is already in the system. Stay on Protonix 40 mg once daily.  We made you a follow up appointment with Dr. Leone Payor on 04-23-2012 at 3:45 PM .

## 2012-03-31 ENCOUNTER — Encounter: Payer: Self-pay | Admitting: Internal Medicine

## 2012-03-31 LAB — ANA: Anti Nuclear Antibody(ANA): NEGATIVE

## 2012-03-31 LAB — HEPATITIS B SURFACE ANTIGEN: Hepatitis B Surface Ag: NEGATIVE

## 2012-03-31 NOTE — Progress Notes (Signed)
Agree with Ms. Oswald Hillock assessment and plan. Initially we thought he had epistaxis and ingested and then regurgitation/vomiting of blood but less clear as time went on. Will see what EGD and lab work-up shows. He also has hx rectal bleeding and will probably need eventual colonoscopy. Iva Boop, MD, [facility]

## 2012-04-01 ENCOUNTER — Encounter: Payer: Self-pay | Admitting: Internal Medicine

## 2012-04-01 ENCOUNTER — Ambulatory Visit (AMBULATORY_SURGERY_CENTER): Payer: Managed Care, Other (non HMO) | Admitting: Internal Medicine

## 2012-04-01 VITALS — BP 136/90 | HR 69 | Temp 97.7°F | Resp 22 | Ht 71.0 in | Wt 210.0 lb

## 2012-04-01 DIAGNOSIS — K920 Hematemesis: Secondary | ICD-10-CM

## 2012-04-01 MED ORDER — SODIUM CHLORIDE 0.9 % IV SOLN: 500.0000 mL | INTRAVENOUS | Status: DC

## 2012-04-01 NOTE — Op Note (Signed)
[facility] [address] [place], [address]   ENDOSCOPY PROCEDURE REPORT  PATIENT: Nicholas Jennings, Nicholas Jennings  MR#: [id number] BIRTHDATE: January 30, 1965 , 47  yrs. old GENDER: Male ENDOSCOPIST: Iva Boop, MD, [facility] PROCEDURE DATE:  04/01/2012 PROCEDURE:  EGD, diagnostic ASA CLASS:     Class II INDICATIONS:  Hematemesis. MEDICATIONS: propofol (Diprivan) 150mg  IV, MAC sedation, administered by CRNA, and These medications were titrated to patient response per physician's verbal order TOPICAL ANESTHETIC: Cetacaine Spray  DESCRIPTION OF PROCEDURE: After the risks benefits and alternatives of the procedure were thoroughly explained, informed consent was obtained.  The LB [id number] endoscope was introduced through the mouth and advanced to the second portion of the duodenum. Without limitations.  The instrument was slowly withdrawn as the mucosa was fully examined.      The upper, middle and distal third of the esophagus were carefully inspected and no abnormalities were noted.  The z-line was well seen at the GEJ.  The endoscope was pushed into the fundus which was normal including a retroflexed view.  The antrum, gastric body, first and second part of the duodenum were unremarkable. Retroflexed views revealed no abnormalities.     The scope was then withdrawn from the patient and the procedure completed.  COMPLICATIONS: There were no complications. ENDOSCOPIC IMPRESSION: Normal EGD - hematemesis likely from epistaxis or tear (healed)  RECOMMENDATIONS: Stop PPI schedule colonoscopy - screening at your convenience   eSigned:  Iva Boop, MD, [facility] 04/01/2012 8:52 AM   CC:The Patient and Catha Gosselin, MD

## 2012-04-01 NOTE — Progress Notes (Signed)
Lidocaine-40mg IV prior to Propofol InductionPropofol given over incremental dosages 

## 2012-04-01 NOTE — Progress Notes (Signed)
Patient did not have preoperative order for IV antibiotic SSI prophylaxis. (G8918)  Patient did not experience any of the following events: a burn prior to discharge; a fall within the facility; wrong site/side/patient/procedure/implant event; or a hospital transfer or hospital admission upon discharge from the facility. (G8907)  

## 2012-04-01 NOTE — Patient Instructions (Addendum)
The exam was normal. Cannot say for sure where the blood came from - nosebleed vs. Tear that has healed. Overall good news.  You can stop the pantoprazole.  Please schedule a screening colonoscopy. We can arrange that - just let us know.  Thank you for choosing me and [facility].  Iva Boop, MD, FACG   YOU HAD AN ENDOSCOPIC PROCEDURE TODAY AT THE [facility]: Refer to the procedure report that was given to you for any specific questions about what was found during the examination.  If the procedure report does not answer your questions, please call your gastroenterologist to clarify.  If you requested that your care partner not be given the details of your procedure findings, then the procedure report has been included in a sealed envelope for you to review at your convenience later.  YOU SHOULD EXPECT: Some feelings of bloating in the abdomen. Passage of more gas than usual.  Walking can help get rid of the air that was put into your GI tract during the procedure and reduce the bloating. If you had a lower endoscopy (such as a colonoscopy or flexible sigmoidoscopy) you may notice spotting of blood in your stool or on the toilet paper. If you underwent a bowel prep for your procedure, then you may not have a normal bowel movement for a few days.  DIET: Your first meal following the procedure should be a light meal and then it is ok to progress to your normal diet.  A half-sandwich or bowl of soup is an example of a good first meal.  Heavy or fried foods are harder to digest and may make you feel nauseous or bloated.  Likewise meals heavy in dairy and vegetables can cause extra gas to form and this can also increase the bloating.  Drink plenty of fluids but you should avoid alcoholic beverages for 24 hours.  ACTIVITY: Your care partner should take you home directly after the procedure.  You should plan to take it easy, moving slowly for the rest of the day.  You can  resume normal activity the day after the procedure however you should NOT DRIVE or use heavy machinery for 24 hours (because of the sedation medicines used during the test).    SYMPTOMS TO REPORT IMMEDIATELY: A gastroenterologist can be reached at any hour.  During normal business hours, 8:30 AM to 5:00 PM Monday through Friday, call [phone].  After hours and on weekends, please call the GI answering service at [phone] who will take a message and have the physician on call contact you.   Following lower endoscopy (colonoscopy or flexible sigmoidoscopy):  Excessive amounts of blood in the stool  Significant tenderness or worsening of abdominal pains  Swelling of the abdomen that is new, acute  Fever of 100F or higher  Following upper endoscopy (EGD)  Vomiting of blood or coffee ground material  New chest pain or pain under the shoulder blades  Painful or persistently difficult swallowing  New shortness of breath  Fever of 100F or higher  Black, tarry-looking stools  FOLLOW UP: If any biopsies were taken you will be contacted by phone or by letter within the next 1-3 weeks.  Call your gastroenterologist if you have not heard about the biopsies in 3 weeks.  Our staff will call the home number listed on your records the next business day following your procedure to check on you and address any questions or concerns that you may have  at that time regarding the information given to you following your procedure. This is a courtesy call and so if there is no answer at the home number and we have not heard from you through the emergency physician on call, we will assume that you have returned to your regular daily activities without incident.  SIGNATURES/CONFIDENTIALITY: You and/or your care partner have signed paperwork which will be entered into your electronic medical record.  These signatures attest to the fact that that the information above on your After Visit Summary has been  reviewed and is understood.  Full responsibility of the confidentiality of this discharge information lies with you and/or your care-partner.

## 2012-04-02 ENCOUNTER — Telehealth: Payer: Self-pay | Admitting: *Deleted

## 2012-04-02 NOTE — Telephone Encounter (Signed)
  Follow up Call-  Call back number 04/01/2012  Post procedure Call Back phone  # [phone]  Permission to leave phone message Yes     Patient questions:  Do you have a fever, pain , or abdominal swelling? no Pain Score  0 *  Have you tolerated food without any problems? yes  Have you been able to return to your normal activities? yes  Do you have any questions about your discharge instructions: Diet   no Medications  no Follow up visit  no  Do you have questions or concerns about your Care? no  Actions: * If pain score is 4 or above: No action needed, pain <4.

## 2012-04-23 ENCOUNTER — Ambulatory Visit (INDEPENDENT_AMBULATORY_CARE_PROVIDER_SITE_OTHER): Payer: Managed Care, Other (non HMO) | Admitting: Family Medicine

## 2012-04-23 ENCOUNTER — Ambulatory Visit: Payer: Managed Care, Other (non HMO) | Admitting: Internal Medicine

## 2012-04-23 ENCOUNTER — Encounter: Payer: Self-pay | Admitting: Family Medicine

## 2012-04-23 VITALS — BP 140/72 | HR 84 | Temp 98.4°F | Wt 207.0 lb

## 2012-04-23 DIAGNOSIS — K760 Fatty (change of) liver, not elsewhere classified: Secondary | ICD-10-CM

## 2012-04-23 DIAGNOSIS — K7689 Other specified diseases of liver: Secondary | ICD-10-CM

## 2012-04-23 DIAGNOSIS — J309 Allergic rhinitis, unspecified: Secondary | ICD-10-CM

## 2012-04-23 DIAGNOSIS — K920 Hematemesis: Secondary | ICD-10-CM

## 2012-04-23 MED ORDER — MOMETASONE FUROATE 50 MCG/ACT NA SUSP: 2.0000 | Freq: Every day | NASAL | Status: DC

## 2012-04-23 NOTE — Assessment & Plan Note (Signed)
New to provider.  Pt had evidence of this on CT scan in 2008 but was not aware of dx.  [place] again showed similar 3 weeks ago.  LFTs only mildly elevated.  Discussed importance of healthy diet, regular exercise, weight loss.  Will follow.

## 2012-04-23 NOTE — Patient Instructions (Addendum)
Follow up as scheduled for your physical Start the nasal spray- 2 sprays each nostril daily (sample will last 1 week) Start OTC Claritin or Zyrtec daily during pollen season (typically until mid-may) Drink plenty of fluids Healthy diet, regular exercise for the liver Call with any questions or concerns Welcome!  We're glad to have you!

## 2012-04-23 NOTE — Progress Notes (Signed)
  Subjective:    Patient ID: Nicholas Jennings, male    DOB: 09/01/1965, 47 y.o.   MRN: [id number]  HPI Cough- sxs started >1 week ago.  Described as dry, hacking.  Keeping him awake at night.  No fevers.  Denies nasal congestion.  Some sinus pressure.  No sore throat.  No ear pain.  No known sick contacts.  No hx of seasonal allergies.    Hematemesis/epistaxis- woke in the middle of the night nauseated, vomited x1- bloody.  Second episode had blood from nose.  Hospitalized x2 days.  Had upper GI- no obvious abnormality.  Bleeding thought to be 2/2 strenuous heave during vomiting.  Saw Dr Leone Payor.  Fatty liver- noted on CT 2008 and again on [place] 3 weeks ago after hematemesis.  Pt does not currently drink ETOH- did in his 33s.  Had normal alpha anti-trypsin antibodies and copper levels.  Pt very distressed about this.  Wants to know what to do.  Review of Systems For ROS see HPI     Objective:   Physical Exam  Constitutional: He appears well-developed and well-nourished. No distress.  HENT:  Head: Normocephalic and atraumatic.  No TTP over sinuses + turbinate edema + PND TMs normal bilaterally  Eyes: Conjunctivae and EOM are normal. Pupils are equal, round, and reactive to light.  Neck: Normal range of motion. Neck supple.  Cardiovascular: Normal rate, regular rhythm and normal heart sounds.   Pulmonary/Chest: Effort normal and breath sounds normal. No respiratory distress. He has no wheezes.  Abdominal: Soft. Bowel sounds are normal. He exhibits no distension. There is no tenderness. There is no rebound and no guarding.  Lymphadenopathy:    He has no cervical adenopathy.  Skin: Skin is warm and dry.          Assessment & Plan:

## 2012-04-23 NOTE — Assessment & Plan Note (Signed)
New.  No clear cause identified.  Pt reports he violently wretches when vomiting and this could have been cause.  No obvious abnormality on upper GI- no visible tears, ulcers, esophageal varices.  Reassurance provided.  Will follow.

## 2012-04-23 NOTE — Assessment & Plan Note (Signed)
New.  This is likely the cause of pt's cough.  Start nasal steroid- sample given- to bridge while OTC antihistamines build up in system.  Reviewed supportive care and red flags that should prompt return.  Pt expressed understanding and is in agreement w/ plan.

## 2012-06-16 ENCOUNTER — Encounter: Payer: Self-pay | Admitting: Family Medicine

## 2012-06-16 ENCOUNTER — Ambulatory Visit (INDEPENDENT_AMBULATORY_CARE_PROVIDER_SITE_OTHER): Payer: Managed Care, Other (non HMO) | Admitting: Family Medicine

## 2012-06-16 VITALS — BP 128/80 | HR 76 | Temp 97.1°F | Ht 72.0 in | Wt 209.2 lb

## 2012-06-16 DIAGNOSIS — L989 Disorder of the skin and subcutaneous tissue, unspecified: Secondary | ICD-10-CM

## 2012-06-16 DIAGNOSIS — I10 Essential (primary) hypertension: Secondary | ICD-10-CM

## 2012-06-16 DIAGNOSIS — K7689 Other specified diseases of liver: Secondary | ICD-10-CM

## 2012-06-16 DIAGNOSIS — K760 Fatty (change of) liver, not elsewhere classified: Secondary | ICD-10-CM

## 2012-06-16 DIAGNOSIS — D696 Thrombocytopenia, unspecified: Secondary | ICD-10-CM

## 2012-06-16 LAB — CBC WITH DIFFERENTIAL/PLATELET
Basophils Absolute: 0 10*3/uL (ref 0.0–0.1)
Basophils Relative: 0.2 % (ref 0.0–3.0)
Eosinophils Absolute: 0.1 10*3/uL (ref 0.0–0.7)
Hemoglobin: 15.2 g/dL (ref 13.0–17.0)
Lymphocytes Relative: 27.7 % (ref 12.0–46.0)
Lymphs Abs: 1.2 10*3/uL (ref 0.7–4.0)
MCHC: 33.6 g/dL (ref 30.0–36.0)
MCV: 89.6 fl (ref 78.0–100.0)
Monocytes Absolute: 0.4 10*3/uL (ref 0.1–1.0)
Neutro Abs: 2.7 10*3/uL (ref 1.4–7.7)
RBC: 5.06 Mil/uL (ref 4.22–5.81)
RDW: 14.4 % (ref 11.5–14.6)

## 2012-06-16 LAB — BASIC METABOLIC PANEL
BUN: 21 mg/dL (ref 6–23)
Calcium: 9.3 mg/dL (ref 8.4–10.5)
Creatinine, Ser: 1 mg/dL (ref 0.4–1.5)
GFR: 86.32 mL/min (ref >60.00)

## 2012-06-16 LAB — LIPID PANEL
Cholesterol: 159 mg/dL (ref 0–200)
HDL: 28.2 mg/dL — ABNORMAL LOW (ref >39.00)
Total CHOL/HDL Ratio: 6
Triglycerides: 243 mg/dL — ABNORMAL HIGH (ref 0.0–149.0)

## 2012-06-16 LAB — HEPATIC FUNCTION PANEL
ALT: 46 U/L (ref 0–53)
Bilirubin, Direct: 0 mg/dL (ref 0.0–0.3)
Total Bilirubin: 0.9 mg/dL (ref 0.3–1.2)

## 2012-06-16 NOTE — Assessment & Plan Note (Signed)
New.  Suspect pre-cancerous or non-melanoma skin cancer.  Refer back to derm

## 2012-06-16 NOTE — Progress Notes (Signed)
  Subjective:    Patient ID: Nicholas Jennings, male    DOB: 02-03-1965, 47 y.o.   MRN: [id number]  HPI Steatosis- pt was discovered to have diffusely fatty liver on [place] and CT (done in 2008).  Pt very concerned about implications of this.  Does not take tylenol.  Does not drink ETOH.  Last LFTs were mildly elevated.  Has not had lipid panel.  No N/V, abd pain.  Thrombocytopenia- pt's plts went from 40s --> 158 in span of 7 days earlier this year.  No abnormal bleeding or bruising.  Has not had CBC since.  Skin lesion- center of nose, now scaly and pigmented.  Last saw derm 2 yrs ago.  Previously saw Dr Danella Deis.   Review of Systems For ROS see HPI     Objective:   Physical Exam  Vitals reviewed. Constitutional: He is oriented to person, place, and time. He appears well-developed and well-nourished. No distress.  HENT:  Head: Normocephalic and atraumatic.  Eyes: Conjunctivae and EOM are normal. Pupils are equal, round, and reactive to light.  Neck: Normal range of motion. Neck supple. No thyromegaly present.  Cardiovascular: Normal rate, regular rhythm, normal heart sounds and intact distal pulses.   No murmur heard. Pulmonary/Chest: Effort normal and breath sounds normal. No respiratory distress.  Abdominal: Soft. Bowel sounds are normal. He exhibits no distension. There is no tenderness. There is no rebound and no guarding.  No hepatomegaly  Musculoskeletal: He exhibits no edema.  Lymphadenopathy:    He has no cervical adenopathy.  Neurological: He is alert and oriented to person, place, and time. No cranial nerve deficit.  Skin: Skin is warm and dry.  1.5 cm mildly hyperpigmented, scaling lesion in center of nose  Psychiatric: He has a normal mood and affect. His behavior is normal.          Assessment & Plan:

## 2012-06-16 NOTE — Assessment & Plan Note (Signed)
New to provider.  Pt's plts jumped dramatically in 7 day period.  Will repeat today to make sure labs were accurate and that pt remains at a good level.

## 2012-06-16 NOTE — Patient Instructions (Addendum)
Follow up in 3 months to recheck BP We'll notify you of your lab results and make any changes if needed Keep up the good work on regular exercise and healthy food choices We'll call you with your derm appt Call with any questions or concerns Have a great summer!!!

## 2012-06-16 NOTE — Assessment & Plan Note (Signed)
Well controlled today w/out meds.  Asymptomatic.  Pt reports BP tends to fluctuate.  Will continue to follow.

## 2012-06-16 NOTE — Assessment & Plan Note (Signed)
Again reviewed labs and imaging studies w/ pt- both previous CT and recent US.  Discussed goal of risk reduction- pt does not drink or smoke, does not take tylenol.  Has not had recent lipids.  Will get lipids and repeat LFTs.  Will continue to follow closely.

## 2012-06-17 ENCOUNTER — Telehealth: Payer: Self-pay | Admitting: *Deleted

## 2012-06-17 DIAGNOSIS — E785 Hyperlipidemia, unspecified: Secondary | ICD-10-CM

## 2012-06-17 MED ORDER — FENOFIBRATE 160 MG PO TABS: 160.0000 mg | ORAL_TABLET | Freq: Every day | ORAL | Status: DC

## 2012-06-17 NOTE — Telephone Encounter (Signed)
Message copied by Verdie Shire on Thu Jun 17, 2012 10:38 AM ------      Message from: Sheliah Hatch      Created: Wed Jun 16, 2012  4:45 PM       Labs look good w/ exception of triglycerides (fatty part of blood).  Based on this, needs to start Fenofibrate 160mg  daily.  Will repeat lipids in 6 months      Sugar, liver function, and platelets look good ------

## 2012-06-17 NOTE — Telephone Encounter (Signed)
Spoke with the pt and informed him of recent lab results and note.  Pt understood and agreed.  New rx sent to the pharmacy by e-script.   Future lab ordered and sent.//AB/CMA

## 2012-08-24 ENCOUNTER — Telehealth: Payer: Self-pay | Admitting: Family Medicine

## 2012-08-24 NOTE — Telephone Encounter (Signed)
Patient's wife is calling and asks that a 90-day supply prescription be written for the patient's Fenofibrate rx. He needs it written like this for his insurance.

## 2012-08-25 NOTE — Telephone Encounter (Signed)
LVM for return call, need to verify pharmacy.

## 2012-08-26 ENCOUNTER — Other Ambulatory Visit: Payer: Self-pay | Admitting: *Deleted

## 2012-08-26 DIAGNOSIS — E781 Pure hyperglyceridemia: Secondary | ICD-10-CM

## 2012-08-26 MED ORDER — FENOFIBRATE 160 MG PO TABS: 160.0000 mg | ORAL_TABLET | Freq: Every day | ORAL | Status: AC

## 2012-08-26 NOTE — Telephone Encounter (Signed)
Fenofibrate rf sent to [facility] [address] per request from spouse

## 2012-10-11 ENCOUNTER — Encounter: Payer: Self-pay | Admitting: Family Medicine

## 2012-10-11 ENCOUNTER — Ambulatory Visit (INDEPENDENT_AMBULATORY_CARE_PROVIDER_SITE_OTHER): Payer: Managed Care, Other (non HMO) | Admitting: Family Medicine

## 2012-10-11 VITALS — BP 126/82 | HR 73 | Temp 98.2°F | Wt 199.2 lb

## 2012-10-11 DIAGNOSIS — R7309 Other abnormal glucose: Secondary | ICD-10-CM

## 2012-10-11 DIAGNOSIS — I10 Essential (primary) hypertension: Secondary | ICD-10-CM

## 2012-10-11 DIAGNOSIS — R7302 Impaired glucose tolerance (oral): Secondary | ICD-10-CM

## 2012-10-11 DIAGNOSIS — E781 Pure hyperglyceridemia: Secondary | ICD-10-CM | POA: Insufficient documentation

## 2012-10-11 LAB — CBC WITH DIFFERENTIAL/PLATELET
Basophils Absolute: 0 10*3/uL (ref 0.0–0.1)
Basophils Relative: 0.3 % (ref 0.0–3.0)
Eosinophils Absolute: 0.1 10*3/uL (ref 0.0–0.7)
Eosinophils Relative: 2.1 % (ref 0.0–5.0)
HCT: 42.4 % (ref 39.0–52.0)
Hemoglobin: 14.7 g/dL (ref 13.0–17.0)
Lymphs Abs: 1.2 10*3/uL (ref 0.7–4.0)
MCHC: 34.7 g/dL (ref 30.0–36.0)
MCV: 88.4 fl (ref 78.0–100.0)
Monocytes Absolute: 0.3 10*3/uL (ref 0.1–1.0)
Neutro Abs: 3.1 10*3/uL (ref 1.4–7.7)
Neutrophils Relative %: 66.1 % (ref 43.0–77.0)
RBC: 4.79 Mil/uL (ref 4.22–5.81)
WBC: 4.7 10*3/uL (ref 4.5–10.5)

## 2012-10-11 LAB — HEPATIC FUNCTION PANEL
ALT: 32 U/L (ref 0–53)
Alkaline Phosphatase: 49 U/L (ref 39–117)
Bilirubin, Direct: 0 mg/dL (ref 0.0–0.3)
Total Protein: 7.7 g/dL (ref 6.0–8.3)

## 2012-10-11 LAB — BASIC METABOLIC PANEL
CO2: 30 mEq/L (ref 19–32)
Calcium: 9.2 mg/dL (ref 8.4–10.5)
Chloride: 107 mEq/L (ref 96–112)
Sodium: 141 mEq/L (ref 135–145)

## 2012-10-11 LAB — LIPID PANEL
LDL Cholesterol: 108 mg/dL — ABNORMAL HIGH (ref 0–99)
Total CHOL/HDL Ratio: 5
Triglycerides: 107 mg/dL (ref 0.0–149.0)
VLDL: 21.4 mg/dL (ref 0.0–40.0)

## 2012-10-11 NOTE — Progress Notes (Signed)
  Subjective:    Patient ID: Nicholas Jennings, male    DOB: Jan 30, 1965, 47 y.o.   MRN: [id number]  HPI HTN- chronic problem, has been attempting to control w/out meds.  Has lost 10 lbs!  Denies CP, SOB, HAs, visual changes, edema.  Hypertriglyceridemia- pt was started on fenofibrate at last visit.  Denies abd pain, N/V/D, myalgias  Biometric screening- needs forms completed today  Review of Systems For ROS see HPI     Objective:   Physical Exam  Vitals reviewed. Constitutional: He is oriented to person, place, and time. He appears well-developed and well-nourished. No distress.  HENT:  Head: Normocephalic and atraumatic.  Eyes: Conjunctivae and EOM are normal. Pupils are equal, round, and reactive to light.  Neck: Normal range of motion. Neck supple. No thyromegaly present.  Cardiovascular: Normal rate, regular rhythm, normal heart sounds and intact distal pulses.   No murmur heard. Pulmonary/Chest: Effort normal and breath sounds normal. No respiratory distress.  Abdominal: Soft. Bowel sounds are normal. He exhibits no distension.  Musculoskeletal: He exhibits no edema.  Lymphadenopathy:    He has no cervical adenopathy.  Neurological: He is alert and oriented to person, place, and time. No cranial nerve deficit.  Skin: Skin is warm and dry.  Psychiatric: He has a normal mood and affect. His behavior is normal.          Assessment & Plan:

## 2012-10-11 NOTE — Patient Instructions (Addendum)
Switch your appt on 12/5 from 9:00 to the 8:30 physical We'll notify you of your lab results and make any changes if needed Keep up the good work!  You look great! (and the weight loss is awesome!) Call with any questions or concerns Happy Fall!!!

## 2012-10-11 NOTE — Assessment & Plan Note (Signed)
Adequate control.  Asymptomatic.  Not currently on meds.  Has lost 10 lbs.  Check labs.  Will follow.

## 2012-10-11 NOTE — Assessment & Plan Note (Signed)
Suspect this has improved since losing weight.  Check labs.  Will follow.

## 2012-10-11 NOTE — Assessment & Plan Note (Signed)
New.  Was started on fenofibrate at last visit.  Tolerating med w/out difficulty.  Check labs.  Adjust meds prn

## 2012-10-13 ENCOUNTER — Telehealth: Payer: Self-pay | Admitting: Family Medicine

## 2012-10-13 NOTE — Telephone Encounter (Signed)
Talked to patient over the phone about his recent lab results. Patient stated that he would drink more water and continue the medication.

## 2012-10-13 NOTE — Telephone Encounter (Signed)
Pt was instructed to increase his water intake to improve Creatinine (not cholesterol) Triglycerides look much better so there are 2 options- continue the meds b/c they're working or stop the meds and recheck the labs in 6 months to make sure the levels have not gone back up

## 2012-10-13 NOTE — Telephone Encounter (Signed)
Please advise 

## 2012-10-13 NOTE — Telephone Encounter (Signed)
Patient is calling in regards to his lab results. Wants to know if he needs to continue on meds and also wants to know if increasing his water intake is the only thing he needs to do for cholesterol. Please advise.

## 2012-10-13 NOTE — Telephone Encounter (Signed)
Called pt and LMOVM

## 2012-11-02 ENCOUNTER — Encounter: Payer: Self-pay | Admitting: Family Medicine

## 2012-11-18 ENCOUNTER — Other Ambulatory Visit: Payer: Self-pay

## 2012-12-15 ENCOUNTER — Telehealth: Payer: Self-pay

## 2012-12-15 NOTE — Telephone Encounter (Signed)
Medication List and allergies:  Reviewed and updated  90 day supply/mail order: na Local prescriptions: [facility] [address]  Immunizations due: tdap > 10 years  A/P:   No changes to FH or PSH or personal hx PSA--none on file Endoscopy 03/2012--Dr Gessner--follow up with [facility] recommended  To Discuss with Provider: Wellness forms to fill out

## 2012-12-15 NOTE — Telephone Encounter (Signed)
Left message for call back Non identifiable  

## 2012-12-17 ENCOUNTER — Encounter: Payer: Self-pay | Admitting: Family Medicine

## 2012-12-17 ENCOUNTER — Ambulatory Visit (INDEPENDENT_AMBULATORY_CARE_PROVIDER_SITE_OTHER): Payer: Managed Care, Other (non HMO) | Admitting: Family Medicine

## 2012-12-17 ENCOUNTER — Other Ambulatory Visit: Payer: Managed Care, Other (non HMO) | Admitting: Family Medicine

## 2012-12-17 VITALS — BP 126/78 | HR 80 | Temp 98.1°F | Resp 16 | Ht 72.0 in | Wt 179.4 lb

## 2012-12-17 DIAGNOSIS — Z0000 Encounter for general adult medical examination without abnormal findings: Secondary | ICD-10-CM

## 2012-12-17 DIAGNOSIS — Z23 Encounter for immunization: Secondary | ICD-10-CM

## 2012-12-17 LAB — HEPATIC FUNCTION PANEL
AST: 28 U/L (ref 0–37)
Alkaline Phosphatase: 49 U/L (ref 39–117)
Bilirubin, Direct: 0.2 mg/dL (ref 0.0–0.3)
Total Bilirubin: 1.3 mg/dL — ABNORMAL HIGH (ref 0.3–1.2)

## 2012-12-17 LAB — TSH: TSH: 2.49 u[IU]/mL (ref 0.35–5.50)

## 2012-12-17 LAB — LIPID PANEL
HDL: 39.6 mg/dL (ref >39.00)
Total CHOL/HDL Ratio: 4
Triglycerides: 59 mg/dL (ref 0.0–149.0)
VLDL: 11.8 mg/dL (ref 0.0–40.0)

## 2012-12-17 LAB — BASIC METABOLIC PANEL
BUN: 26 mg/dL — ABNORMAL HIGH (ref 6–23)
Calcium: 9.5 mg/dL (ref 8.4–10.5)
GFR: 74.61 mL/min (ref >60.00)
Potassium: 4 mEq/L (ref 3.5–5.1)
Sodium: 140 mEq/L (ref 135–145)

## 2012-12-17 LAB — PSA: PSA: 1.32 ng/mL (ref 0.10–4.00)

## 2012-12-17 LAB — CBC WITH DIFFERENTIAL/PLATELET
Basophils Absolute: 0 10*3/uL (ref 0.0–0.1)
Hemoglobin: 14.3 g/dL (ref 13.0–17.0)
Lymphocytes Relative: 27.7 % (ref 12.0–46.0)
Monocytes Relative: 7.2 % (ref 3.0–12.0)
Neutro Abs: 2.2 10*3/uL (ref 1.4–7.7)
Neutrophils Relative %: 62.3 % (ref 43.0–77.0)
RDW: 13.4 % (ref 11.5–14.6)

## 2012-12-17 NOTE — Assessment & Plan Note (Signed)
Pt's PE WNL.  Due for colonoscopy next year.  Check labs.  Anticipatory guidance provided.  

## 2012-12-17 NOTE — Patient Instructions (Signed)
Follow up in 6 months to recheck BP and cholesterol We'll notify you of your lab results and make any changes if needed Keep up the good work on healthy diet and regular exercise Call with any questions or concerns Happy Holidays! 

## 2012-12-17 NOTE — Progress Notes (Signed)
   Subjective:    Patient ID: Nicholas Jennings, male    DOB: 03-04-1965, 47 y.o.   MRN: [id number]  HPI Pre visit review using our clinic review tool, if applicable. No additional management support is needed unless otherwise documented below in the visit note.  CPE- due for colonoscopy next year.  Has lost 20 lbs!   Review of Systems Patient reports no vision/hearing changes, anorexia, fever ,adenopathy, persistant/recurrent hoarseness, swallowing issues, chest pain, palpitations, edema, persistant/recurrent cough, hemoptysis, dyspnea (rest,exertional, paroxysmal nocturnal), gastrointestinal  bleeding (melena, rectal bleeding), abdominal pain, excessive heart burn, GU symptoms (dysuria, hematuria, voiding/incontinence issues) syncope, focal weakness, memory loss, numbness & tingling, skin/hair/nail changes, depression, anxiety, abnormal bruising/bleeding, musculoskeletal symptoms/signs.     Objective:   Physical Exam BP 130/80  Pulse 76  Temp(Src) 98.1 F (36.7 C) (Oral)  Resp 16  Ht 6' (1.829 m)  Wt 179 lb 6 oz (81.364 kg)  BMI 24.32 kg/m2  SpO2 98%  General Appearance:    Alert, cooperative, no distress, appears stated age  Head:    Normocephalic, without obvious abnormality, atraumatic  Eyes:    PERRL, conjunctiva/corneas clear, EOM's intact, fundi    benign, both eyes       Ears:    Normal TM's and external ear canals, both ears  Nose:   Nares normal, septum midline, mucosa normal, no drainage   or sinus tenderness  Throat:   Lips, mucosa, and tongue normal; teeth and gums normal  Neck:   Supple, symmetrical, trachea midline, no adenopathy;       thyroid:  No enlargement/tenderness/nodules  Back:     Symmetric, no curvature, ROM normal, no CVA tenderness  Lungs:     Clear to auscultation bilaterally, respirations unlabored  Chest wall:    No tenderness or deformity  Heart:    Regular rate and rhythm, S1 and S2 normal, no murmur, rub   or gallop  Abdomen:     Soft,  non-tender, bowel sounds active all four quadrants,    no masses, no organomegaly  Genitalia:    Normal male without lesion, discharge or tenderness  Rectal:    Normal tone, normal prostate, no masses or tenderness  Extremities:   Extremities normal, atraumatic, no cyanosis or edema  Pulses:   2+ and symmetric all extremities  Skin:   Skin color, texture, turgor normal, no rashes or lesions  Lymph nodes:   Cervical, supraclavicular, and axillary nodes normal  Neurologic:   CNII-XII intact. Normal strength, sensation and reflexes      throughout          Assessment & Plan:

## 2012-12-20 ENCOUNTER — Encounter: Payer: Self-pay | Admitting: Family Medicine

## 2012-12-20 ENCOUNTER — Telehealth: Payer: Self-pay | Admitting: *Deleted

## 2012-12-20 NOTE — Telephone Encounter (Signed)
12/20/2012  Pt dropped off Prevention Activity Form and Cotinine Form to be completed.  He will be out of town and asked if he could pick up completed forms Wednesday.  Mr. Avers[employer] cell is [phone].  Did not attach billing form, he had 2 other forms from [employer] he just pick up and forgot to bring these.  Put in Tabori's Blue folder.  bw

## 2012-12-20 NOTE — Telephone Encounter (Signed)
Lm @ (4:00pm) asking the pt to RTC regarding message below about lab results.//AB/CMA

## 2012-12-23 ENCOUNTER — Other Ambulatory Visit: Payer: Self-pay | Admitting: *Deleted

## 2012-12-23 ENCOUNTER — Telehealth: Payer: Self-pay | Admitting: *Deleted

## 2012-12-23 DIAGNOSIS — Z0000 Encounter for general adult medical examination without abnormal findings: Secondary | ICD-10-CM

## 2012-12-23 DIAGNOSIS — E781 Pure hyperglyceridemia: Secondary | ICD-10-CM

## 2012-12-23 NOTE — Addendum Note (Signed)
Addended by: Silvio Pate D on: 12/23/2012 05:04 PM   Modules accepted: Orders

## 2012-12-23 NOTE — Telephone Encounter (Signed)
Spoke with the pt and informed him that we was not able to add the Nicotine level to his bloodwork.  So we will need for him to come by and either give [place] a urine or we can draw blood.  Pt agreed and stated that he will be here in 5-10 mins.  Future order was entered and sent.//AB/CMA

## 2012-12-28 LAB — NICOTINE SCREEN, URINE: Cotinine, Urine: NEGATIVE ng/mL

## 2013-06-17 ENCOUNTER — Ambulatory Visit: Payer: Managed Care, Other (non HMO) | Admitting: Family Medicine

## 2013-07-21 ENCOUNTER — Telehealth: Payer: Self-pay | Admitting: Family Medicine

## 2013-07-21 NOTE — Telephone Encounter (Signed)
Nicholas Jennings called and stated that he had a MRI done recently and would like for Dr Beverely Lowabori to take a look at it for a second opinion. Nicholas Jennings will drop MRI results off sometime tomorrow.

## 2013-07-21 NOTE — Telephone Encounter (Signed)
Noted will wait to see if results will come in.

## 2013-07-23 ENCOUNTER — Encounter: Payer: Self-pay | Admitting: Family Medicine

## 2013-07-28 ENCOUNTER — Ambulatory Visit: Payer: Managed Care, Other (non HMO) | Admitting: Family Medicine

## 2014-05-18 IMAGING — CR DG CHEST 1V PORT
1 series · 1 of 1 positions shown · non-contrast
Comparison: None.

CLINICAL DATA: Vomiting blood, cough, shortness of breath

PORTABLE CHEST - 1 VIEW

[AP]
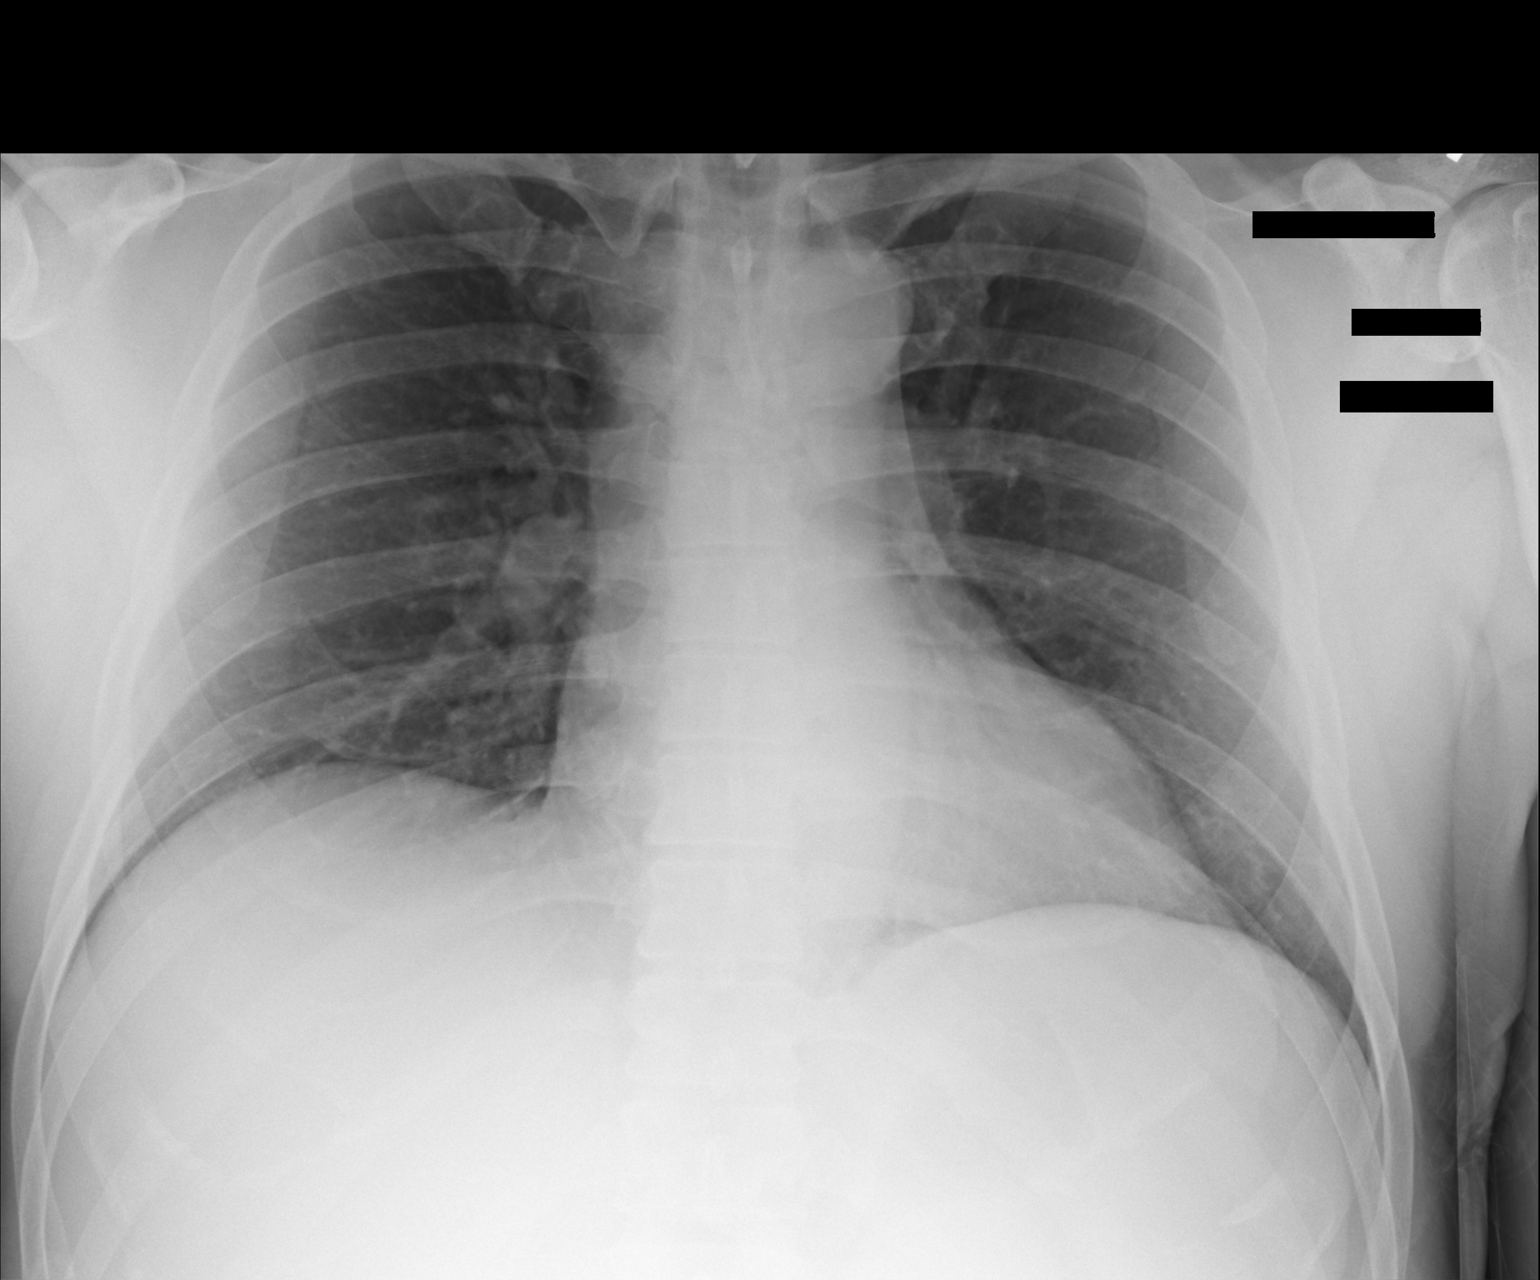

[1 of 1 positions shown; findings below may reference images not displayed]

FINDINGS: No active infiltrate or effusion is seen.  The heart is
within normal limits in size.  No bony abnormality is noted.
IMPRESSION: No active lung disease.

## 2015-12-21 MED FILL — AZITHROMYCIN 250 MG TABLET: 250 | 5 days supply | Qty: 6 | Fill #0

## 2015-12-21 MED FILL — HYDROCODONE-CHLORPHENIRAM S: 10-8 | 10 days supply | Qty: 50 | Fill #0

## 2015-12-21 MED FILL — BENZONATATE 100 MG CAPSULE: 100 | 10 days supply | Qty: 30 | Fill #0

## 2017-11-02 ENCOUNTER — Ambulatory Visit (HOSPITAL_COMMUNITY): Payer: Managed Care, Other (non HMO)

## 2017-11-02 ENCOUNTER — Other Ambulatory Visit (HOSPITAL_COMMUNITY): Payer: Self-pay | Admitting: Orthopedic Surgery

## 2017-11-02 DIAGNOSIS — M7989 Other specified soft tissue disorders: Principal | ICD-10-CM

## 2017-11-02 DIAGNOSIS — M79605 Pain in left leg: Secondary | ICD-10-CM

## 2017-11-03 ENCOUNTER — Ambulatory Visit (HOSPITAL_COMMUNITY): Payer: Self-pay

## 2018-10-08 ENCOUNTER — Encounter (HOSPITAL_COMMUNITY): Payer: Self-pay | Admitting: Emergency Medicine

## 2018-10-08 ENCOUNTER — Ambulatory Visit (HOSPITAL_COMMUNITY)
Admission: EM | Admit: 2018-10-08 | Discharge: 2018-10-08 | Disposition: A | Discharge status: Home / Self Care | Payer: BC Managed Care – PPO | Attending: Emergency Medicine | Admitting: Emergency Medicine

## 2018-10-08 ENCOUNTER — Other Ambulatory Visit: Payer: Self-pay

## 2018-10-08 DIAGNOSIS — I10 Essential (primary) hypertension: Secondary | ICD-10-CM

## 2018-10-08 DIAGNOSIS — W57XXXA Bitten or stung by nonvenomous insect and other nonvenomous arthropods, initial encounter: Secondary | ICD-10-CM

## 2018-10-08 DIAGNOSIS — S50862A Insect bite (nonvenomous) of left forearm, initial encounter: Secondary | ICD-10-CM | POA: Diagnosis not present

## 2018-10-08 MED ORDER — MUPIROCIN CALCIUM 2 % EX CREA: 1.0000 "application " | TOPICAL_CREAM | Freq: Two times a day (BID) | CUTANEOUS | 0 refills | Status: AC

## 2018-10-08 NOTE — ED Triage Notes (Signed)
Pt has a red "rash" to his left elbow that he states started out as a possible insect bite on Wednesday night.  He states he reached his hand into an area to turn off his water to her irrigation system with cobwebs and bugs.  Pt states the area stings and burns.

## 2018-10-08 NOTE — ED Provider Notes (Signed)
[facility]    CSN: [id number] Arrival date & time:         History   Chief Complaint Chief Complaint  Patient presents with  . Rash  . Insect Bite    HPI Nicholas Jennings is a 53 y.o. male.   Patient presents with a "spider bite" on his left elbow x2 days.  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  He has scratched the area and has abrasions from this.  He denies fever, chills, increasing erythema, drainage, increasing pain, warmth in the area, red streaks, or other symptoms.  The history is provided by the patient.    Past Medical History:  Diagnosis Date  . GI bleed   . Hypertension    not on medications  . Thrombocytopenia [facility](HCC)     Patient Active Problem List   Diagnosis Date Noted  . Routine general medical examination at a health care facility 12/17/2012  . Hypertriglyceridemia 10/11/2012  . Skin lesion of face 06/16/2012  . Allergic rhinitis 04/23/2012  . Hepatic steatosis 03/23/2012  . Anterior epistaxis 03/22/2012  . Impaired glucose tolerance 03/22/2012  . Obesity 03/22/2012  . HTN (hypertension) 03/22/2012  . Hematemesis 03/22/2012  . Abnormal transaminases 03/22/2012  . Thrombocytopenia (HCC) 03/22/2012    Past Surgical History:  Procedure Laterality Date  . TRUNK SKIN LESION EXCISIONAL BIOPSY     back  . WRIST SURGERY     left, cyst and bone spur       Home Medications    Prior to Admission medications   Medication Sig Start Date End Date Taking? Authorizing Provider  fenofibrate 160 MG tablet Take 1 tablet (160 mg total) by mouth daily. 08/26/12   Sheliah Hatchabori, Katherine E, MD  mupirocin cream (BACTROBAN) 2 % Apply 1 application topically 2 (two) times daily. 10/08/18   Mickie Bailate, Kaley Jutras H, NP    Family History Family History  Problem Relation Age of Onset  . Thrombocytopenia Mother     Social History Social History   Tobacco Use  . Smoking status: Never Smoker  . Smokeless tobacco: Never Used   Substance Use Topics  . Alcohol use: No  . Drug use: No     Allergies   Patient has no known allergies.   Review of Systems Review of Systems  Constitutional: Negative for chills and fever.  HENT: Negative for ear pain and sore throat.   Eyes: Negative for pain and visual disturbance.  Respiratory: Negative for cough and shortness of breath.   Cardiovascular: Negative for chest pain and palpitations.  Gastrointestinal: Negative for abdominal pain and vomiting.  Genitourinary: Negative for dysuria and hematuria.  Musculoskeletal: Negative for arthralgias and back pain.  Skin: Positive for wound. Negative for color change and rash.  Neurological: Negative for seizures and syncope.  All other systems reviewed and are negative.      Physical Exam Triage Vital Signs ED Triage Vitals  Enc Vitals Group     BP      Pulse      Resp      Temp      Temp src      SpO2      Weight      Height      Head Circumference      Peak Flow      Pain Score      Pain Loc      Pain Edu?      Excl.  in [place]?    No data found.  Updated Vital Signs BP (!) 169/108 (BP Location: Left Arm)   Pulse 83   Temp 97.7 F (36.5 C) (Oral)   Resp 12   SpO2 95%   Visual Acuity Right Eye Distance:   Left Eye Distance:   Bilateral Distance:    Right Eye Near:   Left Eye Near:    Bilateral Near:     Physical Exam Vitals signs and nursing note reviewed.  Constitutional:      Appearance: He is well-developed.  HENT:     Head: Normocephalic and atraumatic.  Eyes:     Conjunctiva/sclera: Conjunctivae normal.  Neck:     Musculoskeletal: Neck supple.  Cardiovascular:     Rate and Rhythm: Normal rate and regular rhythm.     Heart sounds: No murmur.  Pulmonary:     Effort: Pulmonary effort is normal. No respiratory distress.     Breath sounds: Normal breath sounds.  Abdominal:     Palpations: Abdomen is soft.     Tenderness: There is no abdominal tenderness.  Skin:    General: Skin is  warm and dry.     Capillary Refill: Capillary refill takes less than 2 seconds.     Findings: Lesion present.     Comments: 2 mm lesion on left elbow with surrounding abrasions.  No drainage.  See picture for details.    Neurological:     General: No focal deficit present.     Mental Status: He is alert and oriented to person, place, and time.     Sensory: No sensory deficit.     Gait: Gait normal.      UC Treatments / Results  Labs (all labs ordered are listed, but only abnormal results are displayed) Labs Reviewed - No data to display  EKG   Radiology No results found.  Procedures Procedures (including critical care time)  Medications Ordered in UC Medications - No data to display  Initial Impression / Assessment and Plan / UC Course  I have reviewed the triage vital signs and the nursing notes.  Pertinent labs & imaging results that were available during my care of the patient were reviewed by me and considered in my medical decision making (see chart for details).    Insect bite on left forearm.  Elevated blood pressure with known hypertension.  Wound care instructions and signs of infection discussed with patient.  Treating with mupirocin cream.  Instructed patient to return here if he notes signs of infection.  Discussed that his blood pressure is elevated and he needs to have this rechecked by his PCP in the next couple of weeks.  Patient agrees to plan of care.     Final Clinical Impressions(s) / UC Diagnoses   Final diagnoses:  Insect bite of left forearm, initial encounter  Elevated blood pressure reading in office with diagnosis of hypertension     Discharge Instructions     Keep your wound clean and dry.  Wash it gently twice a day with soap and water.  Apply the antibiotic cream twice a day.    Return here if you see signs of infection, such as increased pain, redness, pus-like drainage, warmth, fever, chills, or other concerning symptoms.    Your  blood pressure is elevated today at 169/108.  Please have this rechecked by your primary care provider in 2 weeks.         ED Prescriptions    Medication Sig Dispense  Auth. Provider   mupirocin cream (BACTROBAN) 2 % Apply 1 application topically 2 (two) times daily. 15 g Mickie Bail, NP     PDMP not reviewed this encounter.   Mickie Bail, NP 10/08/18 1050

## 2018-10-08 NOTE — Discharge Instructions (Signed)
Keep your wound clean and dry.  Wash it gently twice a day with soap and water.  Apply the antibiotic cream twice a day.    Return here if you see signs of infection, such as increased pain, redness, pus-like drainage, warmth, fever, chills, or other concerning symptoms.    Your blood pressure is elevated today at 169/108.  Please have this rechecked by your primary care provider in 2 weeks.

## 2019-12-28 ENCOUNTER — Encounter (HOSPITAL_COMMUNITY): Payer: Self-pay

## 2019-12-28 ENCOUNTER — Ambulatory Visit (HOSPITAL_COMMUNITY)
Admission: RE | Admit: 2019-12-28 | Discharge: 2019-12-28 | Disposition: A | Discharge status: Home / Self Care | Payer: BC Managed Care – PPO | Source: Ambulatory Visit | Attending: Family Medicine | Admitting: Family Medicine

## 2019-12-28 ENCOUNTER — Other Ambulatory Visit: Payer: Self-pay

## 2019-12-28 VITALS — BP 178/103 | HR 110 | Temp 100.7°F | Resp 20

## 2019-12-28 DIAGNOSIS — I10 Essential (primary) hypertension: Secondary | ICD-10-CM

## 2019-12-28 DIAGNOSIS — J069 Acute upper respiratory infection, unspecified: Secondary | ICD-10-CM | POA: Diagnosis not present

## 2019-12-28 NOTE — ED Triage Notes (Signed)
Pt presents with non productive cough and nasal drainage since yesterday.  Pt had covid booster shot about 3 weeks ago.

## 2019-12-28 NOTE — Discharge Instructions (Addendum)
Your blood pressure was noted to be elevated during your visit today. If you are currently taking medication for high blood pressure, please ensure you are taking this as directed. If you do not have a history of high blood pressure and your blood pressure remains persistently elevated, you may need to begin taking a medication at some point. You may return here within the next few days to recheck if unable to see your primary care provider or if do not have a one.  BP (!) 178/103 (BP Location: Right Arm)   Pulse (!) 110   Temp (!) 100.7 F (38.2 C) (Oral)   Resp 20   SpO2 94%

## 2019-12-31 NOTE — ED Provider Notes (Signed)
[facility]   [id number] 12/28/19 Arrival Time: 1739  ASSESSMENT & PLAN:  1. Viral URI with cough   2. Elevated blood pressure reading in office with diagnosis of hypertension      Declines COVID testing. Discussed likely viral illness; day #2. Prefers OTC symptom care.    Discharge Instructions     Your blood pressure was noted to be elevated during your visit today. If you are currently taking medication for high blood pressure, please ensure you are taking this as directed. If you do not have a history of high blood pressure and your blood pressure remains persistently elevated, you may need to begin taking a medication at some point. You may return here within the next few days to recheck if unable to see your primary care provider or if do not have a one.  BP (!) 178/103 (BP Location: Right Arm)   Pulse (!) 110   Temp (!) 100.7 F (38.2 C) (Oral)   Resp 20   SpO2 94%         Follow-up Information    [place].   Specialty: Family Medicine Why: If worsening or failing to improve as anticipated. Contact information: [address] [place] [address] [phone]               Reviewed expectations re: course of current medical issues. Questions answered. Outlined signs and symptoms indicating need for more acute intervention. Understanding verbalized. After Visit Summary given.   SUBJECTIVE: History from: patient. Nicholas Jennings is a 54 y.o. male who reports non-prod cough and nasal drainage; abrupt onset; yesterday. Feeling tired. Denies: cough and difficulty breathing. Normal PO intake without n/v/d.  Increased blood pressure noted today. Reports that he is not treated for HTN. Feels this is white coat.  He reports no chest pain on exertion, no dyspnea on exertion, no swelling of ankles, no orthostatic dizziness or lightheadedness, no orthopnea or paroxysmal nocturnal dyspnea, no palpitations  and no intermittent claudication symptoms.   OBJECTIVE:  Vitals:   12/28/19 1816  BP: (!) 178/103  Pulse: (!) 110  Resp: 20  Temp: (!) 100.7 F (38.2 C)  TempSrc: Oral  SpO2: 94%    Abnormal vitals noted.  General appearance: alert; no distress Eyes: PERRLA; EOMI; conjunctiva normal HENT: [place]; AT; with nasal congestion Neck: supple  Lungs: speaks full sentences without difficulty; unlabored Extremities: no edema Skin: warm and dry Neurologic: normal gait Psychological: alert and cooperative; normal mood and affect   No Known Allergies  Past Medical History:  Diagnosis Date  . GI bleed   . Hypertension    not on medications  . Thrombocytopenia (HCC)    Social History   Socioeconomic History  . Marital status: Married    Spouse name: Not on file  . Number of children: Not on file  . Years of education: Not on file  . Highest education level: Not on file  Occupational History  . Not on file  Tobacco Use  . Smoking status: Never Smoker  . Smokeless tobacco: Never Used  Vaping Use  . Vaping Use: Never used  Substance and Sexual Activity  . Alcohol use: No  . Drug use: No  . Sexual activity: Not on file  Other Topics Concern  . Not on file  Social History Narrative  . Not on file   Social Determinants of Health   Financial Resource Strain: Not on file  Food Insecurity: Not on file  Transportation Needs: Not on file  Physical Activity: Not on file  Stress: Not on file  Social Connections: Not on file  Intimate Partner Violence: Not on file   Family History  Problem Relation Age of Onset  . Thrombocytopenia Mother    Past Surgical History:  Procedure Laterality Date  . TRUNK SKIN LESION EXCISIONAL BIOPSY     back  . WRIST SURGERY     left, cyst and bone spur     Mardella Layman, MD 12/31/19 [phone]
